# Patient Record
Sex: Male | Born: 1965
Health system: Southern US, Community
[De-identification: ages and names within clinical notes are randomized; demographics above are authoritative.]

## PROBLEM LIST (undated history)

## (undated) DIAGNOSIS — N1831 Chronic kidney disease, stage 3a: Secondary | ICD-10-CM

## (undated) DIAGNOSIS — T7840XA Allergy, unspecified, initial encounter: Secondary | ICD-10-CM

## (undated) DIAGNOSIS — I10 Essential (primary) hypertension: Secondary | ICD-10-CM

## (undated) DIAGNOSIS — N189 Chronic kidney disease, unspecified: Secondary | ICD-10-CM

## (undated) DIAGNOSIS — Q600 Renal agenesis, unilateral: Secondary | ICD-10-CM

## (undated) HISTORY — DX: Renal agenesis, unilateral: Q60.0

## (undated) HISTORY — DX: Allergy, unspecified, initial encounter: T78.40XA

## (undated) HISTORY — DX: Chronic kidney disease, unspecified: N18.9

## (undated) HISTORY — DX: Essential (primary) hypertension: I10

## (undated) HISTORY — PX: CHEST TUBE INSERTION: SHX231

## (undated) HISTORY — DX: Chronic kidney disease, stage 3a: N18.31

## (undated) HISTORY — PX: VASECTOMY: SHX75

---
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
## 2008-03-18 HISTORY — PX: TYMPANOSTOMY TUBE PLACEMENT: SHX32

## 2009-03-07 ENCOUNTER — Ambulatory Visit: Payer: Self-pay | Admitting: Family Medicine

## 2009-03-07 DIAGNOSIS — Z9189 Other specified personal risk factors, not elsewhere classified: Secondary | ICD-10-CM | POA: Insufficient documentation

## 2009-03-07 DIAGNOSIS — I10 Essential (primary) hypertension: Secondary | ICD-10-CM | POA: Insufficient documentation

## 2009-03-09 LAB — CONVERTED CEMR LAB
Albumin: 3.9 g/dL (ref 3.5–5.2)
BUN: 14 mg/dL (ref 6–23)
Basophils Absolute: 0 10*3/uL (ref 0.0–0.1)
CO2: 29 meq/L (ref 19–32)
Chloride: 104 meq/L (ref 96–112)
Cholesterol: 199 mg/dL (ref 0–200)
Eosinophils Absolute: 0.1 10*3/uL (ref 0.0–0.7)
GFR calc non Af Amer: 64.11 mL/min (ref >60)
Glucose, Bld: 103 mg/dL — ABNORMAL HIGH (ref 70–99)
HCT: 44.5 % (ref 39.0–52.0)
HDL: 39.5 mg/dL (ref >39.00)
Lymphs Abs: 1.5 10*3/uL (ref 0.7–4.0)
MCHC: 32.9 g/dL (ref 30.0–36.0)
MCV: 81.9 fL (ref 78.0–100.0)
Monocytes Absolute: 0.6 10*3/uL (ref 0.1–1.0)
Neutro Abs: 4.2 10*3/uL (ref 1.4–7.7)
Platelets: 264 10*3/uL (ref 150.0–400.0)
Potassium: 3.8 meq/L (ref 3.5–5.1)
RDW: 12.9 % (ref 11.5–14.6)
TSH: 0.79 microintl units/mL (ref 0.35–5.50)
Total Bilirubin: 0.9 mg/dL (ref 0.3–1.2)
VLDL: 15.6 mg/dL (ref 0.0–40.0)

## 2009-10-12 ENCOUNTER — Ambulatory Visit: Payer: Self-pay | Admitting: Family Medicine

## 2009-10-12 DIAGNOSIS — J069 Acute upper respiratory infection, unspecified: Secondary | ICD-10-CM | POA: Insufficient documentation

## 2010-04-17 NOTE — Assessment & Plan Note (Signed)
Summary: COUGH, CONGESTION/ lb   Vital Signs:  Patient profile:   45 year old male Height:      71 inches Weight:      259.2 pounds BMI:     36.28 Temp:     98.8 degrees F oral Pulse rate:   76 / minute Pulse rhythm:   regular BP sitting:   120 / 88  (left arm) Cuff size:   large  Vitals Entered By: Benny Lennert CMA Duncan Dull) (October 12, 2009 11:52 AM)  History of Present Illness: Chief complaint cough and congestion   This 45 Years Old Black Male comes in today with complaints of cough, runny nose, and sore throat. has been ongoing for about 6 days  using some delsym and allegra   no fever, chills, sweats  GEN: WDWN, NAD; alert,appropriate and cooperative throughout exam HEENT: Normocephalic and atraumatic. Throat clear, w/o exudate, no LAD, R TM clear, L TM - good landmarks, No fluid present. rhinnorhea.  Left frontal and maxillary sinuses: NT Right frontal and maxillary sinuses: NT NECK: No ant or post LAD CV: RRR, No M/G/R PULM: no resp distress, no accessory muscles.  No retractions. no w/c/r ABD: S,NT,ND,+BS, No HSM EXTR: no c/c/e PSYCH: full affect, pleasant, conversant   Allergies (verified): No Known Drug Allergies   Impression & Recommendations:  Problem # 1:  URI (ICD-465.9)  His updated medication list for this problem includes:    Hydrocodone-homatropine 5-1.5 Mg/11ml Syrp (Hydrocodone-homatropine) .Marland Kitchen... 1 tsp by mouth at bedtime as needed cough  Complete Medication List: 1)  Benazepril Hcl 40 Mg Tabs (Benazepril hcl) .... Once a day 2)  Multivitamins Caps (Multiple vitamin) .... Once a day 3)  Hydrocodone-homatropine 5-1.5 Mg/44ml Syrp (Hydrocodone-homatropine) .Marland Kitchen.. 1 tsp by mouth at bedtime as needed cough  Patient Instructions: 1)  Upper Respiratory Infection 2)  -Viral Infections 3)  TREATMENT 4)  1. Drink plenty of fluids, but limit caffeine 5)  3. Nasal Sprays: Relieve pressure, promote drainage, open nasal and ear passages. Afrin or  Neosynephrine can be used for only 3 days in row. 6)  4. Nasal Saline: Moisten and smooth membranes, no side effects  7)  5. Cough Suppressants: Several types over the counter such as DM. Codeine and Hydrocodon are prescription narcotic medicines that are powerful cough suppressants. 8)  DM cough suppressant usually are well tolerated with minimal side effects 9)  6. Expectorants: Liquify secretions and improve drainage. (ex=Robitussin or Mucinex) Prescriptions: HYDROCODONE-HOMATROPINE 5-1.5 MG/5ML SYRP (HYDROCODONE-HOMATROPINE) 1 tsp by mouth at bedtime as needed cough  #8 oz x 0   Entered and Authorized by:   Hannah Beat MD   Signed by:   Hannah Beat MD on 10/12/2009   Method used:   Print then Give to Patient   RxID:   [id number]   Current Allergies (reviewed today): No known allergies

## 2010-10-02 ENCOUNTER — Other Ambulatory Visit: Payer: Self-pay | Admitting: Family Medicine

## 2010-10-02 NOTE — Telephone Encounter (Signed)
Ok to refill #90, 0 refills  Schedule CPX this summer

## 2010-10-02 NOTE — Telephone Encounter (Signed)
Patient not seen for cpx

## 2010-12-03 ENCOUNTER — Other Ambulatory Visit (INDEPENDENT_AMBULATORY_CARE_PROVIDER_SITE_OTHER): Payer: Federal, State, Local not specified - PPO

## 2010-12-03 DIAGNOSIS — I10 Essential (primary) hypertension: Secondary | ICD-10-CM

## 2010-12-03 DIAGNOSIS — Z125 Encounter for screening for malignant neoplasm of prostate: Secondary | ICD-10-CM

## 2010-12-03 DIAGNOSIS — Z0000 Encounter for general adult medical examination without abnormal findings: Secondary | ICD-10-CM

## 2010-12-03 LAB — CBC WITH DIFFERENTIAL/PLATELET
Basophils Absolute: 0 10*3/uL (ref 0.0–0.1)
Basophils Relative: 0.7 % (ref 0.0–3.0)
Eosinophils Absolute: 0.2 10*3/uL (ref 0.0–0.7)
Hemoglobin: 14.7 g/dL (ref 13.0–17.0)
Lymphocytes Relative: 24.3 % (ref 12.0–46.0)
MCHC: 33.6 g/dL (ref 30.0–36.0)
MCV: 80.9 fl (ref 78.0–100.0)
Monocytes Absolute: 0.6 10*3/uL (ref 0.1–1.0)
Neutro Abs: 4.8 10*3/uL (ref 1.4–7.7)
Neutrophils Relative %: 64.6 % (ref 43.0–77.0)
RBC: 5.42 Mil/uL (ref 4.22–5.81)
RDW: 14.1 % (ref 11.5–14.6)

## 2010-12-03 LAB — BASIC METABOLIC PANEL
CO2: 27 mEq/L (ref 19–32)
Calcium: 9.2 mg/dL (ref 8.4–10.5)
Chloride: 104 mEq/L (ref 96–112)
Creatinine, Ser: 1.2 mg/dL (ref 0.4–1.5)
Glucose, Bld: 81 mg/dL (ref 70–99)

## 2010-12-03 LAB — HEPATIC FUNCTION PANEL
Albumin: 3.8 g/dL (ref 3.5–5.2)
Alkaline Phosphatase: 75 U/L (ref 39–117)
Total Protein: 6.7 g/dL (ref 6.0–8.3)

## 2010-12-03 LAB — LIPID PANEL
Cholesterol: 200 mg/dL (ref 0–200)
HDL: 39.1 mg/dL (ref >39.00)
Triglycerides: 136 mg/dL (ref 0.0–149.0)
VLDL: 27.2 mg/dL (ref 0.0–40.0)

## 2010-12-07 ENCOUNTER — Encounter: Payer: Self-pay | Admitting: Family Medicine

## 2010-12-10 ENCOUNTER — Encounter: Payer: Self-pay | Admitting: Family Medicine

## 2010-12-10 ENCOUNTER — Ambulatory Visit (INDEPENDENT_AMBULATORY_CARE_PROVIDER_SITE_OTHER): Payer: Federal, State, Local not specified - PPO | Admitting: Family Medicine

## 2010-12-10 VITALS — BP 120/78 | HR 80 | Temp 99.5°F | Ht 70.5 in | Wt 273.4 lb

## 2010-12-10 DIAGNOSIS — I10 Essential (primary) hypertension: Secondary | ICD-10-CM

## 2010-12-10 DIAGNOSIS — Z0000 Encounter for general adult medical examination without abnormal findings: Secondary | ICD-10-CM

## 2010-12-10 DIAGNOSIS — Z23 Encounter for immunization: Secondary | ICD-10-CM

## 2010-12-10 MED ORDER — LOSARTAN POTASSIUM 50 MG PO TABS: 50.0000 mg | ORAL_TABLET | Freq: Every day | ORAL | Status: DC

## 2010-12-10 NOTE — Progress Notes (Addendum)
Subjective:    Patient ID: Andrew Stout, male    DOB: 26-Sep-1965, 45 y.o.   MRN: [id number]  HPI  Andrew Stout, a 45 y.o. male presents today in the office for the following:    Sore a little on the bottom on foot on L No colon ca or prostate Dry cough.  Preventative Health Maintenance Visit:  Health Maintenance Summary Reviewed and updated, unless pt declines services.  Tobacco History Reviewed. Alcohol: No concerns, no excessive use Exercise Habits: Some activity, rec at least 30 mins 5 times a week STD concerns: no risk or activity to increase risk Drug Use: None Encouraged self-testicular check  Health Maintenance  Topic Date Due  . Tetanus/tdap  03/19/2015    Labs reviewed with the patient.   Lipids:    Component Value Date/Time   CHOL 200 12/03/2010 1350   TRIG 136.0 12/03/2010 1350   HDL 39.10 12/03/2010 1350   VLDL 27.2 12/03/2010 1350   CHOLHDL 5 12/03/2010 1350    CBC:    Component Value Date/Time   WBC 7.5 12/03/2010 1350   HGB 14.7 12/03/2010 1350   HCT 43.8 12/03/2010 1350   PLT 304.0 12/03/2010 1350   MCV 80.9 12/03/2010 1350   NEUTROABS 4.8 12/03/2010 1350   LYMPHSABS 1.8 12/03/2010 1350   MONOABS 0.6 12/03/2010 1350   EOSABS 0.2 12/03/2010 1350   BASOSABS 0.0 12/03/2010 1350    Basic Metabolic Panel:    Component Value Date/Time   NA 140 12/03/2010 1350   K 3.8 12/03/2010 1350   CL 104 12/03/2010 1350   CO2 27 12/03/2010 1350   BUN 18 12/03/2010 1350   CREATININE 1.2 12/03/2010 1350   GLUCOSE 81 12/03/2010 1350   CALCIUM 9.2 12/03/2010 1350    Lab Results  Component Value Date   ALT 19 12/03/2010   AST 32 12/03/2010   ALKPHOS 75 12/03/2010   BILITOT 0.1* 12/03/2010    Lab Results  Component Value Date   PSA 0.32 12/03/2010   PSA 0.24 03/07/2009   HTN: Patient is having a dry cough for 1-2 mo and on an ace Stable and at goal No CP, no sob. No HA.  BP Readings from Last 3 Encounters:  12/10/10 120/78  10/12/09 120/88  03/07/09  120/78   Mild L PF mid pain  The PMH, PSH, Social History, Family History, Medications, and allergies have been reviewed in [facility], and have been updated if relevant.   Review of Systems  General: Denies fever, chills, sweats. No significant weight loss. Eyes: Denies blurring,significant itching ENT: Denies earache, sore throat, and hoarseness. Cardiovascular: Denies chest pains, palpitations, dyspnea on exertion Respiratory: above  Breast: no concerns about lumps GI: Denies nausea, vomiting, diarrhea, constipation, change in bowel habits, abdominal pain, melena, hematochezia GU: Denies penile discharge, ED, urinary flow / outflow problems. No STD concerns. Musculoskeletal: above Derm: Denies rash, itching Neuro: Denies  paresthesias, frequent falls, frequent headaches Psych: Denies depression, anxiety Endocrine: Denies cold intolerance, heat intolerance, polydipsia Heme: Denies enlarged lymph nodes Allergy: No hayfever     Objective:   Physical Exam   Physical Exam  Blood pressure 120/78, pulse 80, temperature 99.5 F (37.5 C), temperature source Oral, height 5' 10.5" (1.791 m), weight 273 lb 6.4 oz (124.013 kg), SpO2 99.00%.  PE: GEN: well developed, well nourished, no acute distress Eyes: conjunctiva and lids normal, PERRLA, EOMI ENT: TM clear, nares clear, oral exam WNL Neck: supple, no lymphadenopathy, no thyromegaly, no JVD Pulm: clear to auscultation  and percussion, respiratory effort normal CV: regular rate and rhythm, S1-S2, no murmur, rub or gallop, no bruits, peripheral pulses normal and symmetric, no cyanosis, clubbing, edema or varicosities Chest: no scars, masses, no gynecomastia   GI: soft, non-tender; no hepatosplenomegaly, masses; active bowel sounds all quadrants GU: no hernia, testicular mass, penile discharge, priapism or prostate enlargement Lymph: no cervical, axillary or inguinal adenopathy MSK: gait normal, muscle tone and strength WNL, no joint  swelling, effusions, discoloration, crepitus . Mid portion L PF mildly TTP SKIN: clear, good turgor, color WNL, no rashes, lesions, or ulcerations Neuro: normal mental status, normal strength, sensation, and motion Psych: alert; oriented to person, place and time, normally interactive and not anxious or depressed in appearance.       Assessment & Plan:   1. Routine general medical examination at a health care facility    2. Flu vaccine need  Flu vaccine greater than or equal to 3yo preservative free IM  3. HYPERTENSION  losartan (COZAAR) 50 MG tablet    The patient's preventative maintenance and recommended screening tests for an annual wellness exam were reviewed in full today. Brought up to date unless services declined.  Counselled on the importance of diet, exercise, and its role in overall health and mortality. The patient's FH and SH was reviewed, including their home life, tobacco status, and drug and alcohol status.   HTN: suspect ACE cough, change to ARB

## 2010-12-10 NOTE — Progress Notes (Signed)
  Subjective:    Patient ID: Andrew Stout, male    DOB: 07-19-1965, 45 y.o.   MRN: [id number]  HPI    Review of Systems     Objective:   Physical Exam        Assessment & Plan:

## 2011-07-31 ENCOUNTER — Encounter: Payer: Self-pay | Admitting: Family Medicine

## 2011-07-31 ENCOUNTER — Ambulatory Visit (INDEPENDENT_AMBULATORY_CARE_PROVIDER_SITE_OTHER): Payer: Federal, State, Local not specified - PPO | Admitting: Family Medicine

## 2011-07-31 VITALS — BP 130/78 | HR 80 | Temp 99.1°F | Ht 70.5 in | Wt 275.0 lb

## 2011-07-31 DIAGNOSIS — S0093XA Contusion of unspecified part of head, initial encounter: Secondary | ICD-10-CM

## 2011-07-31 DIAGNOSIS — T07XXXA Unspecified multiple injuries, initial encounter: Secondary | ICD-10-CM

## 2011-07-31 NOTE — Progress Notes (Signed)
  Patient Name: Andrew Stout Date of Birth: 06/01/1965 Age: 46 y.o. Medical Record Number: [id number] Gender: male Date of Encounter: 07/31/2011  History of Present Illness:  Andrew Stout is a 46 y.o. very pleasant male patient who presents with the following:  Larey Seat and hit his head, two weeks ago. Saw his mother on mother's day. No nausea, no dizziness. No LOC, no HA, no blurred vision, no photophobia, no phonophobia. No problems at all except local swelling and pain.   Past Medical History, Surgical History, Social History, Family History, Problem List, Medications, and Allergies have been reviewed and updated if relevant.  Review of Systems:  GEN: No acute illnesses, no fevers, chills. GI: No n/v/d, eating normally Pulm: No SOB Interactive and getting along well at home.  Otherwise, ROS is as per the HPI.   Physical Examination: Filed Vitals:   07/31/11 1359  BP: 130/78  Pulse: 80  Temp: 99.1 F (37.3 C)  TempSrc: Oral  Height: 5' 10.5" (1.791 m)  Weight: 275 lb (124.739 kg)  SpO2: 97%    Body mass index is 38.90 kg/(m^2).   GEN: WDWN, NAD, Non-toxic, Alert & Oriented x 3 HEENT: Atraumatic, Normocephalic. TTP top of head Ears and Nose: No external deformity. EXTR: No clubbing/cyanosis/edema NEURO: Normal gait.  PSYCH: Normally interactive. Conversant. Not depressed or anxious appearing.  Calm demeanor.    Assessment and Plan: 1. Contusion of multiple sites of head and neck     DOI 07/17/2011  Reassured and would not do any additional management

## 2011-08-13 ENCOUNTER — Emergency Department (HOSPITAL_COMMUNITY)
Admission: EM | Admit: 2011-08-13 | Discharge: 2011-08-14 | Disposition: A | Discharge status: Home / Self Care | Payer: Federal, State, Local not specified - PPO | Attending: Emergency Medicine | Admitting: Emergency Medicine

## 2011-08-13 ENCOUNTER — Encounter (HOSPITAL_COMMUNITY): Payer: Self-pay | Admitting: Emergency Medicine

## 2011-08-13 DIAGNOSIS — R509 Fever, unspecified: Secondary | ICD-10-CM | POA: Insufficient documentation

## 2011-08-13 DIAGNOSIS — Z905 Acquired absence of kidney: Secondary | ICD-10-CM | POA: Insufficient documentation

## 2011-08-13 DIAGNOSIS — I10 Essential (primary) hypertension: Secondary | ICD-10-CM | POA: Insufficient documentation

## 2011-08-13 DIAGNOSIS — K6389 Other specified diseases of intestine: Secondary | ICD-10-CM

## 2011-08-13 DIAGNOSIS — R109 Unspecified abdominal pain: Secondary | ICD-10-CM | POA: Insufficient documentation

## 2011-08-13 DIAGNOSIS — K5289 Other specified noninfective gastroenteritis and colitis: Secondary | ICD-10-CM | POA: Insufficient documentation

## 2011-08-13 NOTE — ED Notes (Signed)
Patient with right abdominal pain, sharp in nature.  Low grade fever.  No nausea or vomiting or diarrhea.

## 2011-08-14 ENCOUNTER — Emergency Department (HOSPITAL_COMMUNITY): Payer: Federal, State, Local not specified - PPO

## 2011-08-14 LAB — BASIC METABOLIC PANEL
BUN: 10 mg/dL (ref 6–23)
GFR calc Af Amer: 79 mL/min — ABNORMAL LOW (ref >90)
GFR calc non Af Amer: 68 mL/min — ABNORMAL LOW (ref >90)
Potassium: 3.9 mEq/L (ref 3.5–5.1)

## 2011-08-14 LAB — URINALYSIS, ROUTINE W REFLEX MICROSCOPIC
Glucose, UA: NEGATIVE mg/dL
Hgb urine dipstick: NEGATIVE
Ketones, ur: NEGATIVE mg/dL
Protein, ur: NEGATIVE mg/dL
pH: 6 (ref 5.0–8.0)

## 2011-08-14 LAB — CBC
HCT: 43.9 % (ref 39.0–52.0)
MCH: 26.3 pg (ref 26.0–34.0)
MCV: 78.7 fL (ref 78.0–100.0)
Platelets: 303 10*3/uL (ref 150–400)
RBC: 5.58 MIL/uL (ref 4.22–5.81)

## 2011-08-14 LAB — DIFFERENTIAL
Eosinophils Absolute: 0.1 10*3/uL (ref 0.0–0.7)
Eosinophils Relative: 1 % (ref 0–5)
Lymphocytes Relative: 22 % (ref 12–46)
Lymphs Abs: 2.7 10*3/uL (ref 0.7–4.0)
Monocytes Absolute: 0.8 10*3/uL (ref 0.1–1.0)

## 2011-08-14 MED ORDER — IOHEXOL 300 MG/ML  SOLN
20.0000 mL | INTRAMUSCULAR | Status: AC
  Administered 2011-08-14 (×2): 20 mL via ORAL

## 2011-08-14 MED ORDER — SODIUM CHLORIDE 0.9 % IV SOLN
Freq: Once | INTRAVENOUS | Status: AC
  Administered 2011-08-14: 02:00:00 via INTRAVENOUS

## 2011-08-14 MED ORDER — IOHEXOL 300 MG/ML  SOLN
100.0000 mL | Freq: Once | INTRAMUSCULAR | Status: AC | PRN
  Administered 2011-08-14: 100 mL via INTRAVENOUS

## 2011-08-14 MED ORDER — HYDROCODONE-ACETAMINOPHEN 5-500 MG PO TABS: 1.0000 | ORAL_TABLET | Freq: Four times a day (QID) | ORAL | Status: AC | PRN

## 2011-08-14 NOTE — ED Provider Notes (Addendum)
History     CSN: [id number]  Arrival date & time 08/13/11  2133   First MD Initiated Contact with Patient 08/14/11 0135      Chief Complaint  Patient presents with  . Abdominal Pain    (Consider location/radiation/quality/duration/timing/severity/associated sxs/prior treatment) HPI Comments: RLQ pain with low grade fever Denies N/V/D  The history is provided by the patient.    Past Medical History  Diagnosis Date  . Hypertension   . Chronic kidney disease     one kidney    Past Surgical History  Procedure Date  . Tympanostomy tube placement 2010    right  . Nephrostomy 1975    left  . Chest tube insertion   . Vasectomy     No family history on file.  History  Substance Use Topics  . Smoking status: Never Smoker   . Smokeless tobacco: Not on file  . Alcohol Use: No      Review of Systems  Constitutional: Positive for fever. Negative for chills.  Gastrointestinal: Positive for abdominal pain. Negative for nausea, vomiting and diarrhea.  Genitourinary: Negative for dysuria.    Allergies  Review of patient's allergies indicates no known allergies.  Home Medications   Current Outpatient Rx  Name Route Sig Dispense Refill  . LOSARTAN POTASSIUM 50 MG PO TABS Oral Take 1 tablet (50 mg total) by mouth daily. 30 tablet 11  . ONE-DAILY MULTI VITAMINS PO TABS Oral Take 1 tablet by mouth daily.      Marland Kitchen HYDROCODONE-ACETAMINOPHEN 5-500 MG PO TABS Oral Take 1-2 tablets by mouth every 6 (six) hours as needed for pain. 15 tablet 0    BP 137/81  Pulse 86  Temp(Src) 98.7 F (37.1 C) (Oral)  Resp 18  SpO2 100%  Physical Exam  Constitutional: He appears well-developed and well-nourished.  Neck: Normal range of motion.  Cardiovascular: Normal rate.   Pulmonary/Chest: Effort normal.  Abdominal: He exhibits no distension. There is tenderness in the right lower quadrant and suprapubic area. There is no rebound.  Musculoskeletal: Normal range of motion.    Neurological: He is alert.  Skin: Skin is warm.    ED Course  Procedures (including critical care time)  Labs Reviewed  BASIC METABOLIC PANEL - Abnormal; Notable for the following:    Glucose, Bld 100 (*)    GFR calc non Af Amer 68 (*)    GFR calc Af Amer 79 (*)    All other components within normal limits  CBC - Abnormal; Notable for the following:    WBC 12.1 (*)    All other components within normal limits  DIFFERENTIAL - Abnormal; Notable for the following:    Neutro Abs 8.5 (*)    All other components within normal limits  URINALYSIS, ROUTINE W REFLEX MICROSCOPIC - Abnormal; Notable for the following:    Bilirubin Urine SMALL (*)    All other components within normal limits  LACTIC ACID, PLASMA   Ct Abdomen Pelvis W Contrast  08/14/2011  *RADIOLOGY REPORT*  Clinical Data: Right-sided abdominal pain.  Low grade fever and nausea.  History of previous nephrectomy.  CT ABDOMEN AND PELVIS WITH CONTRAST  Technique:  Multidetector CT imaging of the abdomen and pelvis was performed following the standard protocol during bolus administration of intravenous contrast.  Contrast: OMNIPAQUE IOHEXOL 300 MG/ML  SOLN  Comparison: None.  Findings: The lung bases are clear.  Surgical absence of the left kidney.  The liver, spleen, gallbladder, pancreas, adrenal glands, right kidney,  abdominal aorta, and retroperitoneal lymph nodes are unremarkable. Calcification in the iliac arteries.  The stomach and small bowel are not dilated.  Stool filled colon without distension.  No free air or free fluid in the abdomen.  Pelvis:  The prostate gland is not enlarged.  The bladder wall is not thickened.  Calcified phleboliths.  No significant pelvic lymphadenopathy.  The appendix appears normal.  There is focal of May 8 infiltration in the pericolonic fat in the right lower quadrant.  This looks like a mesenteric process such as focal mesenteric infarct.  Epiploic appendagitis can also have this appearance.   No inflammatory changes involving the sigmoid colon. Scattered sigmoid diverticula.  Normal alignment of the lumbar spine.  IMPRESSION: Small focal area of infiltration in the right lower quadrant pericolonic fat with normal appendix.  Changes suggest focal mesenteric infarct versus epiploic appendagitis.  Original Report Authenticated By: Marlon Pel, M.D.     1. Epiploic appendagitis   2. Abdominal pain    Discussed, CT scan results with patient, as well as with Dr. Hyacinth Meeker, who examined the patient.  As well.  He will be given pain medication for home use strict instructions to return if he develops fever or worsening symptoms.  Nausea, vomiting, diarrhea, or blood in his stools.  He does also been instructed to call [facility] first thing in the morning to have it.  Evaluation in her office   MDM   Abdominal pain is concerning for, appendicitis.  We'll rule out with CT scan.  He does have an elevated white count of 12.1        Arman Filter, NP 08/14/11 0426  Arman Filter, NP 08/14/11 0427  Arman Filter, NP 09/24/11 2003

## 2011-08-14 NOTE — ED Provider Notes (Signed)
Medical screening examination/treatment/procedure(s) were conducted as a shared visit with non-physician practitioner(s) and myself.  I personally evaluated the patient during the encounter  Please see my separate respective documentation pertaining to this patient encounter   Vida Roller, MD 08/14/11 0700

## 2011-08-14 NOTE — Discharge Instructions (Signed)
Abdominal Pain Many things can cause belly (abdominal) pain. Most times, the belly pain is not dangerous. The amount of belly pain does not tell how serious the problem may be. Many cases of belly pain can be watched and treated at home. HOME CARE   Do not take medicines that help you go poop (laxatives) unless told to by your doctor.   Only take medicine as told by your doctor.   Eat or drink as told by your doctor. Your doctor will tell you if you should be on a special diet.  GET HELP RIGHT AWAY IF:   The pain does not go away.   You have a fever.   You keep throwing up (vomiting).   The pain changes and is only in the right or left part of the belly.   You have bloody or tarry looking poop.  MAKE SURE YOU:   Understand these instructions.   Will watch your condition.   Will get help right away if you are not doing well or get worse.  Document Released: 08/21/2007 Document Revised: 02/21/2011 Document Reviewed: 03/20/2009 [facility] Patient Information 2012 [place], [place]. As discussed, is very important that you call [facility] first in the morning for followup in the office.  They can review the CT scan in her office.  Please return immediately if you develop new symptoms, fever, increased pain, nausea, vomiting, diarrhea, blood in stool

## 2011-08-14 NOTE — ED Notes (Signed)
46 year old otherwise healthy male who presents with a complaint of right sided abdominal pain which has been ongoing for a couple of days. He states the last week he been having coughing spells which has seemed to improve. Over the last 2 days he has this intermittent right sided abdominal pain which is both in the right mid abdomen and the right lower quadrant, last for a couple of minutes and then totally goes away. He denies fevers chills nausea vomiting back pain dysuria diarrhea or rectal bleeding. He has never had surgery on his abdomen.  Physical exam shows minimal tenderness in the right lower quadrant and right side. He is non-peritoneal and has no other abdominal tenderness. His heart and lungs appear clear, there is no tachycardia, strong peripheral pulses and the patient is very well-appearing.  Labs show that he is a very slight leukocytosis and a CT scan shows a normal appendix.   Results for orders placed during the hospital encounter of 08/13/11  BASIC METABOLIC PANEL      Component Value Range   Sodium 141  135 - 145 (mEq/L)   Potassium 3.9  3.5 - 5.1 (mEq/L)   Chloride 105  96 - 112 (mEq/L)   CO2 27  19 - 32 (mEq/L)   Glucose, Bld 100 (*) 70 - 99 (mg/dL)   BUN 10  6 - 23 (mg/dL)   Creatinine, Ser 1.61  0.50 - 1.35 (mg/dL)   Calcium 9.4  8.4 - 09.6 (mg/dL)   GFR calc non Af Amer 68 (*) >90 (mL/min)   GFR calc Af Amer 79 (*) >90 (mL/min)  CBC      Component Value Range   WBC 12.1 (*) 4.0 - 10.5 (K/uL)   RBC 5.58  4.22 - 5.81 (MIL/uL)   Hemoglobin 14.7  13.0 - 17.0 (g/dL)   HCT 04.5  40.9 - 81.1 (%)   MCV 78.7  78.0 - 100.0 (fL)   MCH 26.3  26.0 - 34.0 (pg)   MCHC 33.5  30.0 - 36.0 (g/dL)   RDW 91.4  78.2 - 95.6 (%)   Platelets 303  150 - 400 (K/uL)  DIFFERENTIAL      Component Value Range   Neutrophils Relative 70  43 - 77 (%)   Neutro Abs 8.5 (*) 1.7 - 7.7 (K/uL)   Lymphocytes Relative 22  12 - 46 (%)   Lymphs Abs 2.7  0.7 - 4.0 (K/uL)   Monocytes Relative 7  3 -  12 (%)   Monocytes Absolute 0.8  0.1 - 1.0 (K/uL)   Eosinophils Relative 1  0 - 5 (%)   Eosinophils Absolute 0.1  0.0 - 0.7 (K/uL)   Basophils Relative 0  0 - 1 (%)   Basophils Absolute 0.0  0.0 - 0.1 (K/uL)  URINALYSIS, ROUTINE W REFLEX MICROSCOPIC      Component Value Range   Color, Urine YELLOW  YELLOW    APPearance CLEAR  CLEAR    Specific Gravity, Urine 1.023  1.005 - 1.030    pH 6.0  5.0 - 8.0    Glucose, UA NEGATIVE  NEGATIVE (mg/dL)   Hgb urine dipstick NEGATIVE  NEGATIVE    Bilirubin Urine SMALL (*) NEGATIVE    Ketones, ur NEGATIVE  NEGATIVE (mg/dL)   Protein, ur NEGATIVE  NEGATIVE (mg/dL)   Urobilinogen, UA 1.0  0.0 - 1.0 (mg/dL)   Nitrite NEGATIVE  NEGATIVE    Leukocytes, UA NEGATIVE  NEGATIVE    Ct Abdomen Pelvis W Contrast  08/14/2011  *RADIOLOGY REPORT*  Clinical Data: Right-sided abdominal pain.  Low grade fever and nausea.  History of previous nephrectomy.  CT ABDOMEN AND PELVIS WITH CONTRAST  Technique:  Multidetector CT imaging of the abdomen and pelvis was performed following the standard protocol during bolus administration of intravenous contrast.  Contrast: OMNIPAQUE IOHEXOL 300 MG/ML  SOLN  Comparison: None.  Findings: The lung bases are clear.  Surgical absence of the left kidney.  The liver, spleen, gallbladder, pancreas, adrenal glands, right kidney, abdominal aorta, and retroperitoneal lymph nodes are unremarkable. Calcification in the iliac arteries.  The stomach and small bowel are not dilated.  Stool filled colon without distension.  No free air or free fluid in the abdomen.  Pelvis:  The prostate gland is not enlarged.  The bladder wall is not thickened.  Calcified phleboliths.  No significant pelvic lymphadenopathy.  The appendix appears normal.  There is focal of May 8 infiltration in the pericolonic fat in the right lower quadrant.  This looks like a mesenteric process such as focal mesenteric infarct.  Epiploic appendagitis can also have this appearance.   No inflammatory changes involving the sigmoid colon. Scattered sigmoid diverticula.  Normal alignment of the lumbar spine.  IMPRESSION: Small focal area of infiltration in the right lower quadrant pericolonic fat with normal appendix.  Changes suggest focal mesenteric infarct versus epiploic appendagitis.  Original Report Authenticated By: Marlon Pel, M.D.   After examining the patient and looking at his CAT scan, I believe that this is probably epiploic appendagitis and much less likely related to a mesenteric ischemia. He has not had atrial fibrillation and has no other risk factors for this disease process.  Filed Vitals:   08/14/11 0307  BP: 137/81  Pulse: 86  Temp: 98.7 F (37.1 C)  Resp: 18    Vital signs are normal, I discussed the signs with the patient and reexamined his abdomen and believe he is stable for discharge at this time. He will followup with the general surgeons after calling them in the morning to establish a followup appointment.  Medical screening examination/treatment/procedure(s) were conducted as a shared visit with non-physician practitioner(s) and myself.  I personally evaluated the patient during the encounter     Vida Roller, MD 08/14/11 [phone]

## 2011-08-16 ENCOUNTER — Encounter (INDEPENDENT_AMBULATORY_CARE_PROVIDER_SITE_OTHER): Payer: Self-pay | Admitting: Surgery

## 2011-08-16 ENCOUNTER — Ambulatory Visit (INDEPENDENT_AMBULATORY_CARE_PROVIDER_SITE_OTHER): Payer: Federal, State, Local not specified - PPO | Admitting: Surgery

## 2011-08-16 VITALS — BP 142/88 | HR 80 | Temp 98.2°F | Resp 14 | Ht 70.0 in | Wt 271.0 lb

## 2011-08-16 DIAGNOSIS — K6389 Other specified diseases of intestine: Secondary | ICD-10-CM

## 2011-08-16 DIAGNOSIS — K5289 Other specified noninfective gastroenteritis and colitis: Secondary | ICD-10-CM

## 2011-08-16 NOTE — Progress Notes (Signed)
Patient ID: Andrew Stout, male   DOB: 04-19-1965, 46 y.o.   MRN: [id number]  Chief Complaint  Patient presents with  . Abdominal Pain    HPI Andrew Stout is a 46 y.o. male.  This is a very pleasant gentleman referred by the emergency department for right lower quadrant abdominal pain. He had the sudden onset of right lower quadrant abdominal pain on Monday. He described it as sharp and continuous in nature. It did not referring where else. He had no nausea or vomiting. Bowel movements are normal. He did have a low-grade temperature. He presented to the emergency department where a CAT scan was performed suggesting epiploic appendicitis. He is now feeling much better and only needing pain medications at night. He now denies fevers HPI  Past Medical History  Diagnosis Date  . Hypertension   . Chronic kidney disease     one kidney    Past Surgical History  Procedure Date  . Tympanostomy tube placement 2010    right  . Nephrostomy 1975    left  . Chest tube insertion   . Vasectomy     History reviewed. No pertinent family history.  Social History History  Substance Use Topics  . Smoking status: Never Smoker   . Smokeless tobacco: Not on file  . Alcohol Use: No    No Known Allergies  Current Outpatient Prescriptions  Medication Sig Dispense Refill  . HYDROcodone-acetaminophen (VICODIN) 5-500 MG per tablet Take 1-2 tablets by mouth every 6 (six) hours as needed for pain.  15 tablet  0  . losartan (COZAAR) 50 MG tablet Take 1 tablet (50 mg total) by mouth daily.  30 tablet  11  . Multiple Vitamin (MULTIVITAMIN) tablet Take 1 tablet by mouth daily.          Review of Systems Review of Systems  All other systems reviewed and are negative.    Blood pressure 142/88, pulse 80, temperature 98.2 F (36.8 C), resp. rate 14, height 5\' 10"  (1.778 m), weight 271 lb (122.925 kg).  Physical Exam Physical Exam  Constitutional: He appears well-developed and  well-nourished. No distress.  HENT:  Head: Normocephalic and atraumatic.  Neck: Normal range of motion. Neck supple.  Cardiovascular: Normal rate, regular rhythm, normal heart sounds and intact distal pulses.   No murmur heard. Pulmonary/Chest: Effort normal and breath sounds normal. No respiratory distress. He has no wheezes.  Abdominal: Soft. Bowel sounds are normal.       There is mild tenderness with guarding in the right lower quadrant  Skin: He is not diaphoretic.    Data Reviewed I have reviewed his CAT scan  Assessment    Epiploic appendicitis    Plan    As he is improving, and there are no further recommendations other than conservative management with pain control. I explained the diagnosis to him and his wife in detail. Should he have return of his discomfort or fevers he will call me as soon as possible.       Lyam Provencio A 08/16/2011, 1:44 PM

## 2011-09-27 NOTE — ED Provider Notes (Signed)
Medical screening examination/treatment/procedure(s) were performed by non-physician practitioner and as supervising physician I was immediately available for consultation/collaboration.    Vida Roller, MD 09/27/11 2259

## 2011-12-12 ENCOUNTER — Other Ambulatory Visit: Payer: Self-pay | Admitting: Family Medicine

## 2011-12-12 NOTE — Telephone Encounter (Signed)
Ok to refill 30, 2 refills  Schedule CPX

## 2011-12-12 NOTE — Telephone Encounter (Signed)
Refill request for Losartan 50 mg. Last OV was CPE on 12/10/10. Ok to refill?

## 2011-12-12 NOTE — Telephone Encounter (Signed)
Rx losartan 50 mg #30 2 R called in to [facility]. Patient scheduled appt for 01/06/12 @2 :45 pm for CPE

## 2012-01-06 ENCOUNTER — Encounter: Payer: Self-pay | Admitting: Family Medicine

## 2012-01-06 ENCOUNTER — Ambulatory Visit (INDEPENDENT_AMBULATORY_CARE_PROVIDER_SITE_OTHER): Payer: Federal, State, Local not specified - PPO | Admitting: Family Medicine

## 2012-01-06 VITALS — BP 116/80 | HR 84 | Temp 98.5°F | Ht 70.0 in | Wt 265.5 lb

## 2012-01-06 DIAGNOSIS — Z125 Encounter for screening for malignant neoplasm of prostate: Secondary | ICD-10-CM

## 2012-01-06 DIAGNOSIS — Z0000 Encounter for general adult medical examination without abnormal findings: Secondary | ICD-10-CM

## 2012-01-06 DIAGNOSIS — Z13220 Encounter for screening for lipoid disorders: Secondary | ICD-10-CM

## 2012-01-06 NOTE — Progress Notes (Signed)
Nature conservation officer at [facility] [address] [place] [address] Phone: [phone] Fax: [phone]  Date:  01/06/2012   Name:  Andrew Stout [facility]   DOB:  1966-03-05   MRN:  [id number] Gender: male Age: 46 y.o.  PCP:  Hannah Beat, MD  Evaluating MD: Hannah Beat, MD   Chief Complaint: Annual Exam   History of Present Illness:  Andrew Stout is a 46 y.o. pleasant patient who presents with the following:  Some sore knees, some in the back.   Preventative Health Maintenance Visit:  Health Maintenance Summary Reviewed and updated, unless pt declines services.  Tobacco History Reviewed. Alcohol: No concerns, no excessive use Exercise Habits: Some activity, rec at least 30 mins 5 times a week STD concerns: no risk or activity to increase risk Drug Use: None Encouraged self-testicular check  Health Maintenance  Topic Date Due  . Influenza Vaccine  11/16/2012  . Tetanus/tdap  03/19/2015    Patient Active Problem List  Diagnosis  . HYPERTENSION  . CHICKENPOX, HX OF  . Epiploic appendagitis    Past Medical History  Diagnosis Date  . Hypertension   . Chronic kidney disease     one kidney    Past Surgical History  Procedure Date  . Tympanostomy tube placement 2010    right  . Nephrostomy 1975    left  . Chest tube insertion   . Vasectomy     History  Substance Use Topics  . Smoking status: Never Smoker   . Smokeless tobacco: Never Used  . Alcohol Use: No    No family history on file.  No Known Allergies  Medication list has been reviewed and updated.  Outpatient Prescriptions Prior to Visit  Medication Sig Dispense Refill  . losartan (COZAAR) 50 MG tablet TAKE 1 TABLET BY MOUTH EVERY DAY  30 tablet  2  . Multiple Vitamin (MULTIVITAMIN) tablet Take 1 tablet by mouth daily.          Review of Systems:   General: Denies fever, chills, sweats. No significant weight loss. Eyes: Denies blurring,significant itching ENT:  Denies earache, sore throat, and hoarseness. Cardiovascular: Denies chest pains, palpitations, dyspnea on exertion Respiratory: Denies cough, dyspnea at rest,wheeezing Breast: no concerns about lumps GI: EPIPLOIC APENDIGITIS IN 07/2011, NONOP RESOLVED GU: Denies penile discharge, ED, urinary flow / outflow problems. No STD concerns. Musculoskeletal: occ joint pains Derm: Denies rash, itching Neuro: Denies  paresthesias, frequent falls, frequent headaches Psych: Denies depression, anxiety Endocrine: Denies cold intolerance, heat intolerance, polydipsia Heme: Denies enlarged lymph nodes Allergy: No hayfever   Physical Examination: Filed Vitals:   01/06/12 1449  BP: 116/80  Pulse: 84  Temp: 98.5 F (36.9 C)  TempSrc: Oral  Height: 5\' 10"  (1.778 m)  Weight: 265 lb 8 oz (120.43 kg)    Body mass index is 38.10 kg/(m^2). Ideal Body Weight: Weight in (lb) to have BMI = 25: 173.9    Wt Readings from Last 3 Encounters:  01/06/12 265 lb 8 oz (120.43 kg)  08/16/11 271 lb (122.925 kg)  07/31/11 275 lb (124.739 kg)    GEN: well developed, well nourished, no acute distress Eyes: conjunctiva and lids normal, PERRLA, EOMI ENT: TM clear, nares clear, oral exam WNL Neck: supple, no lymphadenopathy, no thyromegaly, no JVD Pulm: clear to auscultation and percussion, respiratory effort normal CV: regular rate and rhythm, S1-S2, no murmur, rub or gallop, no bruits, peripheral pulses normal and symmetric, no cyanosis, clubbing, edema or varicosities Chest:  no scars, masses GI: soft, non-tender; no hepatosplenomegaly, masses; active bowel sounds all quadrants GU: no hernia, testicular mass, penile discharge, or prostate enlargement Lymph: no cervical, axillary or inguinal adenopathy MSK: gait normal, muscle tone and strength WNL, no joint swelling, effusions, discoloration, crepitus  SKIN: clear, good turgor, color WNL, no rashes, lesions, or ulcerations Neuro: normal mental status, normal  strength, sensation, and motion Psych: alert; oriented to person, place and time, normally interactive and not anxious or depressed in appearance.  Assessment and Plan:  1. Routine general medical examination at a health care facility  Basic metabolic panel, CBC with Differential  2. Screening for lipoid disorders  Lipid panel  3. Special screening for malignant neoplasm of prostate  PSA   The patient's preventative maintenance and recommended screening tests for an annual wellness exam were reviewed in full today. Brought up to date unless services declined.  Counselled on the importance of diet, exercise, and its role in overall health and mortality. The patient's FH and SH was reviewed, including their home life, tobacco status, and drug and alcohol status.   Exercise.  Orders Today:  Orders Placed This Encounter  Procedures  . Basic metabolic panel  . CBC with Differential  . Lipid panel  . PSA    Updated Medication List: (Includes new medications, updates to list, dose adjustments) No orders of the defined types were placed in this encounter.    Medications Discontinued: Medications Discontinued During This Encounter  Medication Reason  . losartan (COZAAR) 50 MG tablet      Hannah Beat, MD

## 2012-01-07 LAB — CBC WITH DIFFERENTIAL/PLATELET
Eosinophils Absolute: 0.1 10*3/uL (ref 0.0–0.7)
Eosinophils Relative: 0.6 % (ref 0.0–5.0)
HCT: 45.9 % (ref 39.0–52.0)
Lymphs Abs: 1.7 10*3/uL (ref 0.7–4.0)
MCHC: 32.9 g/dL (ref 30.0–36.0)
MCV: 81.2 fl (ref 78.0–100.0)
Monocytes Absolute: 0.6 10*3/uL (ref 0.1–1.0)
Platelets: 318 10*3/uL (ref 150.0–400.0)
RDW: 13.7 % (ref 11.5–14.6)
WBC: 9.2 10*3/uL (ref 4.5–10.5)

## 2012-01-07 LAB — LIPID PANEL
Cholesterol: 217 mg/dL — ABNORMAL HIGH (ref 0–200)
Total CHOL/HDL Ratio: 6

## 2012-01-08 LAB — BASIC METABOLIC PANEL
BUN: 12 mg/dL (ref 6–23)
CO2: 29 mEq/L (ref 19–32)
Chloride: 105 mEq/L (ref 96–112)
Creatinine, Ser: 1.3 mg/dL (ref 0.4–1.5)
Glucose, Bld: 84 mg/dL (ref 70–99)
Potassium: 4.5 mEq/L (ref 3.5–5.1)

## 2012-01-09 LAB — PSA: PSA: 0.26 ng/mL (ref 0.10–4.00)

## 2012-01-10 ENCOUNTER — Encounter: Payer: Self-pay | Admitting: *Deleted

## 2012-01-10 ENCOUNTER — Telehealth: Payer: Self-pay

## 2012-01-10 NOTE — Telephone Encounter (Signed)
Pt requested results 01/09/12.Patient notified as instructed by telephone.

## 2013-01-01 ENCOUNTER — Encounter: Payer: Self-pay | Admitting: Family Medicine

## 2013-01-01 ENCOUNTER — Ambulatory Visit (INDEPENDENT_AMBULATORY_CARE_PROVIDER_SITE_OTHER): Payer: Federal, State, Local not specified - PPO | Admitting: Family Medicine

## 2013-01-01 ENCOUNTER — Telehealth: Payer: Self-pay

## 2013-01-01 VITALS — BP 128/88 | HR 70 | Temp 98.3°F | Ht 70.0 in | Wt 255.8 lb

## 2013-01-01 DIAGNOSIS — G5600 Carpal tunnel syndrome, unspecified upper limb: Secondary | ICD-10-CM

## 2013-01-01 DIAGNOSIS — L259 Unspecified contact dermatitis, unspecified cause: Secondary | ICD-10-CM | POA: Insufficient documentation

## 2013-01-01 DIAGNOSIS — G5601 Carpal tunnel syndrome, right upper limb: Secondary | ICD-10-CM | POA: Insufficient documentation

## 2013-01-01 MED ORDER — IBUPROFEN 800 MG PO TABS: 800.0000 mg | ORAL_TABLET | Freq: Three times a day (TID) | ORAL | Status: DC | PRN

## 2013-01-01 MED ORDER — TRIAMCINOLONE ACETONIDE 0.5 % EX OINT: TOPICAL_OINTMENT | Freq: Two times a day (BID) | CUTANEOUS | Status: DC

## 2013-01-01 NOTE — Telephone Encounter (Signed)
Dr Ermalene Searing; pt thought Ibuprofen 800mg  would be sent to [facility].Please advise.

## 2013-01-01 NOTE — Telephone Encounter (Signed)
Notify pt - rx sent

## 2013-01-01 NOTE — Patient Instructions (Addendum)
Ibuprofen 800 mg every 8 hours as needed for pain. Wear carpal tunnel brace at bedtime on right wrist. Call if not improving in 2 weeks.. Or follow up with PCP. Start steroid cream on rash twice daily.

## 2013-01-01 NOTE — Progress Notes (Signed)
  Subjective:    Patient ID: Andrew Stout, male    DOB: [birth year], 47 y.o.   MRN: [id number]  HPI  47 year old male pt of Dr. Cyndie Chime presents with  New onset rash 1.5 weeks ago. Puffy red on left upper chest. Itchy, no painful.  Applied calamine lotion, without much relief.  No fever. No flu like symptoms.   Wife had similar rash last week as well... ? Reaction to something in environment.   He works for [employer], Magazine features editor.. having pain  Down elbow, arm and pain in palm, numbness in 4th and 5th digit.  no pain in neck, occ pain in upper arm.     Review of Systems  Constitutional: Negative for fever and fatigue.  HENT: Negative for ear pain.   Eyes: Negative for pain.  Respiratory: Negative for shortness of breath.   Cardiovascular: Negative for chest pain.       Objective:   Physical Exam  Constitutional: He is oriented to person, place, and time. Vital signs are normal. He appears well-developed and well-nourished.  HENT:  Head: Normocephalic.  Right Ear: Hearing normal.  Left Ear: Hearing normal.  Nose: Nose normal.  Mouth/Throat: Oropharynx is clear and moist and mucous membranes are normal.  Neck: Trachea normal. Carotid bruit is not present. No mass and no thyromegaly present.  Cardiovascular: Normal rate, regular rhythm and normal pulses.  Exam reveals no gallop, no distant heart sounds and no friction rub.   No murmur heard. No peripheral edema  Pulmonary/Chest: Effort normal and breath sounds normal. No respiratory distress.  Musculoskeletal:       Cervical back: Normal. He exhibits normal range of motion and no tenderness.       Left forearm: He exhibits no tenderness.       Left hand: He exhibits tenderness. He exhibits normal range of motion.  Neg spurling's , neg ulnar compression, neg tinel and phalen  Neurological: He is alert and oriented to person, place, and time.  Skin: Skin is warm, dry and intact. No rash noted.  Erythematous  patches  On left upper chest, no vesicles, no pustules, no flake.  Psychiatric: He has a normal mood and affect. His speech is normal and behavior is normal. Thought content normal.          Assessment & Plan:

## 2013-01-04 NOTE — Telephone Encounter (Signed)
Left message on cell phone that Rx has been sent in to [facility] on [address].

## 2013-01-09 NOTE — Assessment & Plan Note (Signed)
Wear brace at night, NSAIDs, avoid compression of carpal tunnel at work.

## 2013-01-09 NOTE — Assessment & Plan Note (Signed)
Topical steroid to treat. Follow up if not improving.

## 2013-01-14 ENCOUNTER — Telehealth: Payer: Self-pay | Admitting: Family Medicine

## 2013-01-14 NOTE — Telephone Encounter (Signed)
Spoke with Andrew Stout.  He will stop by tomorrow to get fitted for left cock up wrist splint.

## 2013-01-14 NOTE — Telephone Encounter (Signed)
Pt left vm stating that he saw Dr. Patsy Lager 2 weeks ago for carpel tunnel in R arm.  He has been wearing his splint as directed but is now having trouble with his left arm.  He wants to know if he needs to come in for another appt for another splint for that arm or just continue taking ibuprofen as directed and see if it gets any better.

## 2013-01-14 NOTE — Telephone Encounter (Signed)
He can just get a left cock up wrist splint, i don't need to see him  Dx CTS

## 2013-02-24 ENCOUNTER — Ambulatory Visit (INDEPENDENT_AMBULATORY_CARE_PROVIDER_SITE_OTHER): Payer: Federal, State, Local not specified - PPO | Admitting: Family Medicine

## 2013-02-24 ENCOUNTER — Encounter: Payer: Self-pay | Admitting: Family Medicine

## 2013-02-24 VITALS — BP 130/90 | HR 76 | Temp 98.5°F | Ht 70.0 in | Wt 257.5 lb

## 2013-02-24 DIAGNOSIS — G5600 Carpal tunnel syndrome, unspecified upper limb: Secondary | ICD-10-CM

## 2013-02-24 DIAGNOSIS — M2550 Pain in unspecified joint: Secondary | ICD-10-CM

## 2013-02-24 DIAGNOSIS — G5601 Carpal tunnel syndrome, right upper limb: Secondary | ICD-10-CM

## 2013-02-24 DIAGNOSIS — B350 Tinea barbae and tinea capitis: Secondary | ICD-10-CM

## 2013-02-24 MED ORDER — KETOCONAZOLE 2 % EX SHAM: 1.0000 "application " | MEDICATED_SHAMPOO | CUTANEOUS | Status: DC

## 2013-02-24 MED ORDER — IBUPROFEN 800 MG PO TABS: 800.0000 mg | ORAL_TABLET | Freq: Three times a day (TID) | ORAL | Status: DC | PRN

## 2013-02-24 MED ORDER — SERTACONAZOLE NITRATE 2 % EX CREA: TOPICAL_CREAM | CUTANEOUS | Status: DC

## 2013-02-24 NOTE — Progress Notes (Signed)
Date:  02/24/2013   Name:  Andrew Stout [facility]   DOB:  05/25/1965   MRN:  [id number] Gender: male Age: 47 y.o.  Primary Physician:  Hannah Beat, MD   Chief Complaint: Hand Pain   Subjective:   History of Present Illness:  Andrew Stout is a 47 y.o. pleasant patient who presents with the following:  CTS: The patent presents a > 3 mo history of numbness and tingling, greatest in medial aspect of hands, some in ulnar aspect. Some weakness with grip strength. Shome occ. pain going to forearm. Bothers the most at night. Aggravated by lifting arms above head and at night.  Both hands. Numb. Losing grip strength.  Splints? Yes, minimal improvement Prior Injecton: none EMG: none Hand of Dominance: R   + rash behind L ear and itchy, in hair line  Motrin helps with joint aches  Patient Active Problem List   Diagnosis Date Noted  . Contact dermatitis 01/01/2013  . Right carpal tunnel syndrome 01/01/2013  . Epiploic appendagitis 08/16/2011  . HYPERTENSION 03/07/2009    Past Medical History  Diagnosis Date  . Hypertension   . Chronic kidney disease     one kidney    Past Surgical History  Procedure Laterality Date  . Tympanostomy tube placement  2010    right  . Nephrostomy  1975    left  . Chest tube insertion    . Vasectomy      History   Social History  . Marital Status: Married    Spouse Name: N/A    Number of Children: N/A  . Years of Education: N/A   Occupational History  . Not on file.   Social History Main Topics  . Smoking status: Never Smoker   . Smokeless tobacco: Never Used  . Alcohol Use: No  . Drug Use: No  . Sexual Activity: Not on file   Other Topics Concern  . Not on file   Social History Narrative   Regular exercise-no    No family history on file.  No Known Allergies  Medication list has been reviewed and updated.  Review of Systems:  GEN: No fevers, chills. Nontoxic. Primarily MSK c/o today. MSK: Detailed  in the HPI GI: tolerating PO intake without difficulty Neuro: No numbness, parasthesias, or tingling associated. Otherwise the pertinent positives of the ROS are noted above.   Objective:   Physical Examination: BP 130/90  Pulse 76  Temp(Src) 98.5 F (36.9 C) (Oral)  Ht 5\' 10"  (1.778 m)  Wt 257 lb 8 oz (116.801 kg)  BMI 36.95 kg/m2  Ideal Body Weight: Weight in (lb) to have BMI = 25: 173.9   GEN: WDWN, NAD, Non-toxic, Alert & Oriented x 3 HEENT: Atraumatic, Normocephalic.  Ears and Nose: No external deformity. EXTR: No clubbing/cyanosis/edema NEURO: Normal gait.  PSYCH: Normally interactive. Conversant. Not depressed or anxious appearing.  Calm demeanor.  SKIN, darker coloration behind left ear and in hair line  Hand: B Ecchymosis or edema: neg ROM wrist/hand/digits/elbow: full  Carpals, MCP's, digits: NT Distal Ulna and Radius: NT Supination lift test: neg Ecchymosis or edema: neg Cysts/nodules: neg Finkelstein's test: neg Snuffbox tenderness: neg Scaphoid tubercle: NT Hook of Hamate: NT Resisted supination: NT Full composite fist Grip, all digits: 5/5 str No tenosynovitis Axial load test: neg Phalen's: + Tinel's: + Atrophy: neg  Hand sensation: intact   No results found.  Assessment & Plan:    Right carpal tunnel syndrome  Tinea capitis  Arthralgia  Carpal Tunnel Syndrome: We discussed the anatomy involved, and that carpal tunnel syndrome primarily involves the median nerve, and this typically affects digits one through 3. We also discussed that mild cases of carpal tunnel syndrome are often improved with night splints, and it is very reasonable to consider a carpal tunnel injection. If the patient does have moderate to severe carpal tunnel syndrome based on NCV, then it is certainly reasonable to consider carpal tunnel release, which was discussed with the patient. We also discussed his severe carpal tunnel syndrome can lead to permanent nerve  impairment even if released. At this point, the patient like to proceed conservatively.   CTS Injection, RIGHT Verbal consent was obtained. Risks (including rare risk of infection), benefits, and alternatives were discussed. Prepped with Chloraprep and Ethyl Chloride used for anesthesia. Under sterile conditions,  the patient was injected just ulnar to the palmaris longus tendon at the wrist flexion crease.  The needle was inserted at 45 degree angle aiming distally. Aspiration showed no blood. Medication flowed freely without resistance.  Needle size: 22 gauge 1 1/2 inch Injection: 1/2 cc of Lidocaine 1% and 1/2 cc of Depo-Medrol 40 mg   New medications, updates to list, dose adjustments: Meds ordered this encounter  Medications  . Sertaconazole Nitrate 2 % CREA    Sig: Apply bid    Dispense:  30 g    Refill:  0  . ketoconazole (NIZORAL) 2 % shampoo    Sig: Apply 1 application topically 2 (two) times a week.    Dispense:  120 mL    Refill:  3  . ibuprofen (ADVIL,MOTRIN) 800 MG tablet    Sig: Take 1 tablet (800 mg total) by mouth every 8 (eight) hours as needed.    Dispense:  90 tablet    Refill:  5    Signed,  Shanise Balch T. Janisa Labus, MD, CAQ Sports Medicine  [facility] [address] [place] [address] Phone: [phone] Fax: [phone]  Updated Complete Medication List:   Medication List       This list is accurate as of: 02/24/13  6:18 PM.  Always use your most recent med list.               ibuprofen 800 MG tablet  Commonly known as:  ADVIL,MOTRIN  Take 1 tablet (800 mg total) by mouth every 8 (eight) hours as needed.     ketoconazole 2 % shampoo  Commonly known as:  NIZORAL  Apply 1 application topically 2 (two) times a week.     multivitamin tablet  Take 1 tablet by mouth daily.     Sertaconazole Nitrate 2 % Crea  Apply bid

## 2013-02-24 NOTE — Progress Notes (Signed)
Pre-visit discussion using our clinic review tool. No additional management support is needed unless otherwise documented below in the visit note.  

## 2013-04-14 ENCOUNTER — Ambulatory Visit (INDEPENDENT_AMBULATORY_CARE_PROVIDER_SITE_OTHER): Payer: Federal, State, Local not specified - PPO | Admitting: Internal Medicine

## 2013-04-14 ENCOUNTER — Encounter: Payer: Self-pay | Admitting: Internal Medicine

## 2013-04-14 VITALS — BP 136/84 | HR 87 | Temp 98.7°F | Wt 257.5 lb

## 2013-04-14 DIAGNOSIS — J019 Acute sinusitis, unspecified: Secondary | ICD-10-CM

## 2013-04-14 MED ORDER — FLUTICASONE PROPIONATE 50 MCG/ACT NA SUSP: 2.0000 | Freq: Every day | NASAL | Status: DC

## 2013-04-14 MED ORDER — AMOXICILLIN 875 MG PO TABS: 875.0000 mg | ORAL_TABLET | Freq: Two times a day (BID) | ORAL | Status: DC

## 2013-04-14 NOTE — Progress Notes (Signed)
HPI: Pt presents with concerns regarding sinus congestion and pressure. Symptoms started five days ago. He endorses sinus congestion with green productive discharge, headache, nose bleeds, sinus pressure, fatigue, and productive cough. Pt denies fever, chills, and chest congestion. Pt tried OTC Mucinex, with some relief, but he stopped due to nose bleeds.     Review of Systems    Past Medical History  Diagnosis Date  . Hypertension   . Chronic kidney disease     one kidney    No family history on file.  History   Social History  . Marital Status: Married    Spouse Name: N/A    Number of Children: N/A  . Years of Education: N/A   Occupational History  . Not on file.   Social History Main Topics  . Smoking status: Never Smoker   . Smokeless tobacco: Never Used  . Alcohol Use: No  . Drug Use: No  . Sexual Activity: Not on file   Other Topics Concern  . Not on file   Social History Narrative   Regular exercise-no    No Known Allergies   Constitutional: Positive headache, fatigue. Denies fever or abrupt weight changes.  HEENT:  Positive eye pain, pressure behind the eyes, facial pain, nasal congestion, nose bleeds, and sore throat. Denies eye redness, ear pain, ringing in the ears, wax buildup, runny nose. Respiratory: Positive cough and thick green sputum production. Denies difficulty breathing or shortness of breath.  Cardiovascular: Denies chest pain, chest tightness, palpitations or swelling in the hands or feet.   No other specific complaints in a complete review of systems (except as listed in HPI above).  Objective:    BP 136/84  Pulse 87  Temp(Src) 98.7 F (37.1 C) (Oral)  Wt 257 lb 8 oz (116.801 kg)  SpO2 98% Wt Readings from Last 3 Encounters:  04/14/13 257 lb 8 oz (116.801 kg)  02/24/13 257 lb 8 oz (116.801 kg)  01/01/13 255 lb 12 oz (116.007 kg)    General: Appears his stated age, well developed, well nourished in NAD. HEENT: Head: normal  shape and size; Eyes: sclera white, no icterus, conjunctiva pink, PERRLA and EOMs intact; Ears: Tm's gray and intact, normal light reflex; Nose: mucosa red/inflamed; frontal tenderness R > L. septum midline; Throat/Mouth: + PND. Teeth present, mucosa pink and moist, no exudate noted, no lesions or ulcerations noted.  Neck: Mild cervical lymphadenopathy. Neck supple, trachea midline. No massses, lumps or thyromegaly present.  Cardiovascular: Normal rate and rhythm. S1,S2 noted.  No murmur, rubs or gallops noted. No JVD or BLE edema. No carotid bruits noted. Pulmonary/Chest: Normal effort and positive vesicular breath sounds. No respiratory distress. No wheezes, rales or ronchi noted.      Assessment & Plan:   Acute bacterial sinusitis  Can use a Neti Pot which can be purchased from your local drug store. Flonase 2 sprays each nostril for 3 days and then as needed. Amoxicillin 875mg  PO BID for 10 days Stop Mucinex  RTC as needed or if symptoms persist.  Demari Gales, Jacques Earthlyourtney S, Student-NP

## 2013-04-14 NOTE — Progress Notes (Signed)
HPI  Pt presents to the clinic today with c.o nose bleeds, cough, headache, and facial pressure. This started about 5 days ago. The cough is productive of thick green mucous. He denies fever but has had fatigue and body aches. He has been trying OTC Mucinex but stopped when he started having nose bleeds. He has no history of allergies or breathing problems.  Review of Systems    Past Medical History  Diagnosis Date  . Hypertension   . Chronic kidney disease     one kidney    No family history on file.  History   Social History  . Marital Status: Married    Spouse Name: N/A    Number of Children: N/A  . Years of Education: N/A   Occupational History  . Not on file.   Social History Main Topics  . Smoking status: Never Smoker   . Smokeless tobacco: Never Used  . Alcohol Use: No  . Drug Use: No  . Sexual Activity: Not on file   Other Topics Concern  . Not on file   Social History Narrative   Regular exercise-no    No Known Allergies   Constitutional: Positive headache, fatigue and fever. Denies fever or  abrupt weight changes.  HEENT:  Positive eye pain, pressure behind the eyes, facial pain, nasal congestion. Denies eye redness, ear pain, ringing in the ears, wax buildup, runny nose or bloody nose. Respiratory: Positive cough and thick green sputum production. Denies difficulty breathing or shortness of breath.  Cardiovascular: Denies chest pain, chest tightness, palpitations or swelling in the hands or feet.   No other specific complaints in a complete review of systems (except as listed in HPI above).  Objective:    BP 136/84  Pulse 87  Temp(Src) 98.7 F (37.1 C) (Oral)  Wt 257 lb 8 oz (116.801 kg)  SpO2 98% Wt Readings from Last 3 Encounters:  04/14/13 257 lb 8 oz (116.801 kg)  02/24/13 257 lb 8 oz (116.801 kg)  01/01/13 255 lb 12 oz (116.007 kg)    General: Appears his stated age, well developed, well nourished in NAD. HEENT: Head: normal shape and  size; Eyes: sclera white, no icterus, conjunctiva pink, PERRLA and EOMs intact; Ears: Tm's gray and intact, normal light reflex; Nose: mucosa pink and dry, septum midline; Throat/Mouth: + PND. Teeth present, mucosa pink and moist, no exudate noted, no lesions or ulcerations noted.  Neck: Neck supple, trachea midline. No massses, lumps or thyromegaly present.  Cardiovascular: Normal rate and rhythm. S1,S2 noted.  No murmur, rubs or gallops noted. No JVD or BLE edema. No carotid bruits noted. Pulmonary/Chest: Normal effort and positive vesicular breath sounds. No respiratory distress. No wheezes, rales or ronchi noted.      Assessment & Plan:   Acute bacterial sinusitis  Can use a Neti Pot which can be purchased from your local drug store. Flonase 2 sprays each nostril for 3 days and then as needed. Augmentin BID for 10 days Stop the mucinex as this seems to be drying you out and causing the nose bleeds  RTC as needed or if symptoms persist.

## 2013-04-14 NOTE — Patient Instructions (Signed)

## 2013-04-14 NOTE — Progress Notes (Signed)
Pre-visit discussion using our clinic review tool. No additional management support is needed unless otherwise documented below in the visit note.  

## 2015-05-08 ENCOUNTER — Other Ambulatory Visit (INDEPENDENT_AMBULATORY_CARE_PROVIDER_SITE_OTHER): Payer: Federal, State, Local not specified - PPO

## 2015-05-08 ENCOUNTER — Encounter: Payer: Federal, State, Local not specified - PPO | Admitting: Family Medicine

## 2015-05-08 DIAGNOSIS — Z125 Encounter for screening for malignant neoplasm of prostate: Secondary | ICD-10-CM

## 2015-05-08 DIAGNOSIS — Z13220 Encounter for screening for lipoid disorders: Secondary | ICD-10-CM | POA: Diagnosis not present

## 2015-05-08 DIAGNOSIS — Z79899 Other long term (current) drug therapy: Secondary | ICD-10-CM

## 2015-05-08 LAB — CBC WITH DIFFERENTIAL/PLATELET
BASOS PCT: 0.5 % (ref 0.0–3.0)
Basophils Absolute: 0 10*3/uL (ref 0.0–0.1)
EOS PCT: 2.4 % (ref 0.0–5.0)
Eosinophils Absolute: 0.2 10*3/uL (ref 0.0–0.7)
HCT: 42.7 % (ref 39.0–52.0)
Hemoglobin: 14.4 g/dL (ref 13.0–17.0)
LYMPHS ABS: 2.1 10*3/uL (ref 0.7–4.0)
Lymphocytes Relative: 23.5 % (ref 12.0–46.0)
MCHC: 33.7 g/dL (ref 30.0–36.0)
MCV: 78.3 fl (ref 78.0–100.0)
MONO ABS: 0.7 10*3/uL (ref 0.1–1.0)
Monocytes Relative: 8.3 % (ref 3.0–12.0)
NEUTROS ABS: 5.8 10*3/uL (ref 1.4–7.7)
NEUTROS PCT: 65.3 % (ref 43.0–77.0)
PLATELETS: 241 10*3/uL (ref 150.0–400.0)
RBC: 5.45 Mil/uL (ref 4.22–5.81)
RDW: 14 % (ref 11.5–15.5)
WBC: 8.9 10*3/uL (ref 4.0–10.5)

## 2015-05-08 LAB — BASIC METABOLIC PANEL
BUN: 12 mg/dL (ref 6–23)
CHLORIDE: 105 meq/L (ref 96–112)
CO2: 28 mEq/L (ref 19–32)
Calcium: 8.9 mg/dL (ref 8.4–10.5)
Creatinine, Ser: 1.21 mg/dL (ref 0.40–1.50)
GFR: 82 mL/min (ref >60.00)
GLUCOSE: 103 mg/dL — AB (ref 70–99)
POTASSIUM: 3.5 meq/L (ref 3.5–5.1)
Sodium: 142 mEq/L (ref 135–145)

## 2015-05-08 LAB — HEPATIC FUNCTION PANEL
ALK PHOS: 63 U/L (ref 39–117)
ALT: 48 U/L (ref 0–53)
AST: 44 U/L — ABNORMAL HIGH (ref 0–37)
Albumin: 4.1 g/dL (ref 3.5–5.2)
BILIRUBIN DIRECT: 0.2 mg/dL (ref 0.0–0.3)
TOTAL PROTEIN: 6.6 g/dL (ref 6.0–8.3)
Total Bilirubin: 0.8 mg/dL (ref 0.2–1.2)

## 2015-05-08 LAB — MICROALBUMIN / CREATININE URINE RATIO
Creatinine,U: 231.5 mg/dL
MICROALB UR: 2.8 mg/dL — AB (ref 0.0–1.9)
Microalb Creat Ratio: 1.2 mg/g (ref 0.0–30.0)

## 2015-05-08 LAB — LIPID PANEL
CHOLESTEROL: 168 mg/dL (ref 0–200)
HDL: 48.9 mg/dL (ref >39.00)
LDL CALC: 106 mg/dL — AB (ref 0–99)
NonHDL: 118.61
TRIGLYCERIDES: 61 mg/dL (ref 0.0–149.0)
Total CHOL/HDL Ratio: 3
VLDL: 12.2 mg/dL (ref 0.0–40.0)

## 2015-05-08 LAB — PSA: PSA: 0.17 ng/mL (ref 0.10–4.00)

## 2015-05-10 ENCOUNTER — Encounter: Payer: Self-pay | Admitting: Family Medicine

## 2015-05-10 ENCOUNTER — Ambulatory Visit (INDEPENDENT_AMBULATORY_CARE_PROVIDER_SITE_OTHER): Payer: Federal, State, Local not specified - PPO | Admitting: Family Medicine

## 2015-05-10 VITALS — BP 126/84 | HR 78 | Temp 98.3°F | Ht 69.25 in | Wt 270.0 lb

## 2015-05-10 DIAGNOSIS — Z0000 Encounter for general adult medical examination without abnormal findings: Secondary | ICD-10-CM | POA: Diagnosis not present

## 2015-05-10 DIAGNOSIS — Z23 Encounter for immunization: Secondary | ICD-10-CM | POA: Diagnosis not present

## 2015-05-10 DIAGNOSIS — Q600 Renal agenesis, unilateral: Secondary | ICD-10-CM | POA: Diagnosis not present

## 2015-05-10 HISTORY — DX: Renal agenesis, unilateral: Q60.0

## 2015-05-10 NOTE — Progress Notes (Signed)
Dr. Frederico Hamman T. Rileigh Kawashima, MD, [facility] [place], [address] Phone: [phone] Fax: [phone]  05/10/2015  Patient: Andrew Stout, MRN: [id number], DOB: Jun 09, 1965, 50 y.o.  Primary Physician:  Owens Loffler, MD   Chief Complaint  Patient presents with  . Annual Exam   Subjective:   Andrew Stout is a 50 y.o. pleasant patient who presents with the following:  Preventative Health Maintenance Visit:  Health Maintenance Summary Reviewed and updated, unless pt declines services.  Tobacco History Reviewed. Alcohol: No concerns, no excessive use Exercise Habits: Some activity, rec at least 30 mins 5 times a week STD concerns: no risk or activity to increase risk Drug Use: None Encouraged self-testicular check  13 pound weight gain  Health Maintenance  Topic Date Due  . HIV Screening  06/04/1981  . INFLUENZA VACCINE  10/17/2015  . TETANUS/TDAP  05/09/2025   Immunization History  Administered Date(s) Administered  . Influenza Split 12/10/2010, 01/06/2012  . Influenza-Unspecified 12/17/2014  . Td 03/18/2005  . Tdap 05/10/2015   Patient Active Problem List   Diagnosis Date Noted  . Solitary kidney, congenital 05/10/2015  . Right carpal tunnel syndrome 01/01/2013  . HYPERTENSION 03/07/2009   Past Medical History  Diagnosis Date  . Hypertension   . Chronic kidney disease     one kidney  . Solitary kidney, congenital 05/10/2015   Past Surgical History  Procedure Laterality Date  . Tympanostomy tube placement  2010    right  . Nephrostomy  1975    left  . Chest tube insertion    . Vasectomy     Social History   Social History  . Marital Status: Married    Spouse Name: N/A  . Number of Children: N/A  . Years of Education: N/A   Occupational History  . Not on file.   Social History Main Topics  . Smoking status: Never Smoker   . Smokeless tobacco: Never Used  . Alcohol Use:  No  . Drug Use: No  . Sexual Activity: Not on file   Other Topics Concern  . Not on file   Social History Narrative   Regular exercise-no   No family history on file. No Known Allergies  Medication list has been reviewed and updated.   General: Denies fever, chills, sweats. No significant weight loss. Eyes: Denies blurring,significant itching ENT: Denies earache, sore throat, and hoarseness. Cardiovascular: Denies chest pains, palpitations, dyspnea on exertion Respiratory: Denies cough, dyspnea at rest,wheeezing Breast: no concerns about lumps GI: Denies nausea, vomiting, diarrhea, constipation, change in bowel habits, abdominal pain, melena, hematochezia GU: Denies penile discharge, ED, urinary flow / outflow problems. No STD concerns. Musculoskeletal: Denies back pain, joint pain Derm: Denies rash, itching Neuro: Denies  paresthesias, frequent falls, frequent headaches Psych: Denies depression, anxiety Endocrine: Denies cold intolerance, heat intolerance, polydipsia Heme: Denies enlarged lymph nodes Allergy: No hayfever  Objective:   BP 126/84 mmHg  Pulse 78  Temp(Src) 98.3 F (36.8 C) (Oral)  Ht 5' 9.25" (1.759 m)  Wt 270 lb (122.471 kg)  BMI 39.58 kg/m2 Ideal Body Weight: Weight in (lb) to have BMI = 25: 170.2  No exam data present  GEN: well developed, well nourished, no acute distress Eyes: conjunctiva and lids normal, PERRLA, EOMI ENT: TM clear, nares clear, oral exam WNL Neck: supple, no lymphadenopathy, no thyromegaly, no JVD Pulm: clear to auscultation and percussion, respiratory effort normal CV: regular rate and rhythm, S1-S2,  no murmur, rub or gallop, no bruits, peripheral pulses normal and symmetric, no cyanosis, clubbing, edema or varicosities GI: soft, non-tender; no hepatosplenomegaly, masses; active bowel sounds all quadrants GU: no hernia, testicular mass, penile discharge Lymph: no cervical, axillary or inguinal adenopathy MSK: gait normal,  muscle tone and strength WNL, no joint swelling, effusions, discoloration, crepitus  SKIN: clear, good turgor, color WNL, no rashes, lesions, or ulcerations Neuro: normal mental status, normal strength, sensation, and motion Psych: alert; oriented to person, place and time, normally interactive and not anxious or depressed in appearance. All labs reviewed with patient.  Lipids:    Component Value Date/Time   CHOL 168 05/08/2015 1440   TRIG 61.0 05/08/2015 1440   HDL 48.90 05/08/2015 1440   LDLDIRECT 163.7 01/06/2012 1537   VLDL 12.2 05/08/2015 1440   CHOLHDL 3 05/08/2015 1440   CBC: CBC Latest Ref Rng 05/08/2015 01/06/2012 08/13/2011  WBC 4.0 - 10.5 K/uL 8.9 9.2 12.1(H)  Hemoglobin 13.0 - 17.0 g/dL 14.4 15.1 14.7  Hematocrit 39.0 - 52.0 % 42.7 45.9 43.9  Platelets 150.0 - 400.0 K/uL 241.0 318.0 197    Basic Metabolic Panel:    Component Value Date/Time   NA 142 05/08/2015 1440   K 3.5 05/08/2015 1440   CL 105 05/08/2015 1440   CO2 28 05/08/2015 1440   BUN 12 05/08/2015 1440   CREATININE 1.21 05/08/2015 1440   GLUCOSE 103* 05/08/2015 1440   CALCIUM 8.9 05/08/2015 1440   Hepatic Function Latest Ref Rng 05/08/2015 12/03/2010 03/07/2009  Total Protein 6.0 - 8.3 g/dL 6.6 6.7 7.0  Albumin 3.5 - 5.2 g/dL 4.1 3.8 3.9  AST 0 - 37 U/L 44(H) 32 40(H)  ALT 0 - 53 U/L 48 19 34  Alk Phosphatase 39 - 117 U/L 63 75 59  Total Bilirubin 0.2 - 1.2 mg/dL 0.8 0.1(L) 0.9  Bilirubin, Direct 0.0 - 0.3 mg/dL 0.2 0.3 0.0    Lab Results  Component Value Date   TSH 1.25 12/03/2010   Lab Results  Component Value Date   PSA 0.17 05/08/2015   PSA 0.26 01/06/2012   PSA 0.32 12/03/2010    Assessment and Plan:   Healthcare maintenance  Need for prophylactic vaccination with combined diphtheria-tetanus-pertussis (DTP) vaccine - Plan: Tdap vaccine greater than or equal to 7yo IM  Solitary kidney, congenital  Health Maintenance Exam: The patient's preventative maintenance and recommended  screening tests for an annual wellness exam were reviewed in full today. Brought up to date unless services declined.  Counselled on the importance of diet, exercise, and its role in overall health and mortality. The patient's FH and SH was reviewed, including their home life, tobacco status, and drug and alcohol status.  Follow-up: No Follow-up on file. Unless noted, follow-up in 1 year for Health Maintenance Exam.  Orders Placed This Encounter  Procedures  . Tdap vaccine greater than or equal to 7yo IM    Signed,  Olman Yono T. Robet Crutchfield, MD   Patient's Medications  New Prescriptions   No medications on file  Previous Medications   FLUTICASONE (FLONASE) 50 MCG/ACT NASAL SPRAY    Place 2 sprays into both nostrils daily.   MULTIPLE VITAMIN (MULTIVITAMIN) TABLET    Take 1 tablet by mouth daily.    Modified Medications   No medications on file  Discontinued Medications   AMOXICILLIN (AMOXIL) 875 MG TABLET    Take 1 tablet (875 mg total) by mouth 2 (two) times daily.   IBUPROFEN (ADVIL,MOTRIN) 800 MG TABLET  Take 1 tablet (800 mg total) by mouth every 8 (eight) hours as needed.   KETOCONAZOLE (NIZORAL) 2 % SHAMPOO    Apply 1 application topically 2 (two) times a week.   SERTACONAZOLE NITRATE 2 % CREA    Apply bid

## 2015-05-10 NOTE — Progress Notes (Signed)
Pre visit review using our clinic review tool, if applicable. No additional management support is needed unless otherwise documented below in the visit note. 

## 2016-01-02 DIAGNOSIS — H6980 Other specified disorders of Eustachian tube, unspecified ear: Secondary | ICD-10-CM | POA: Diagnosis not present

## 2016-01-02 DIAGNOSIS — J340 Abscess, furuncle and carbuncle of nose: Secondary | ICD-10-CM | POA: Diagnosis not present

## 2016-03-27 ENCOUNTER — Ambulatory Visit (INDEPENDENT_AMBULATORY_CARE_PROVIDER_SITE_OTHER): Payer: Federal, State, Local not specified - PPO | Admitting: Family Medicine

## 2016-03-27 ENCOUNTER — Encounter: Payer: Self-pay | Admitting: Family Medicine

## 2016-03-27 VITALS — BP 164/108 | HR 78 | Temp 98.5°F | Ht 69.25 in | Wt 279.8 lb

## 2016-03-27 DIAGNOSIS — J00 Acute nasopharyngitis [common cold]: Secondary | ICD-10-CM

## 2016-03-27 MED ORDER — AMOXICILLIN 500 MG PO CAPS: 1000.0000 mg | ORAL_CAPSULE | Freq: Two times a day (BID) | ORAL | 0 refills | Status: AC

## 2016-03-27 NOTE — Progress Notes (Signed)
Pre visit review using our clinic review tool, if applicable. No additional management support is needed unless otherwise documented below in the visit note. 

## 2016-03-27 NOTE — Progress Notes (Signed)
Dr. Karleen HampshireSpencer T. Takasha Vetere, MD, [facility] [address] [place], [id number] Phone: [phone] Fax: [phone]  03/27/2016  Patient: Andrew CaprioDarwin K Stout, MRN: [id number], DOB: 05/17/1965, 51 y.o.  Primary Physician:  Hannah BeatSpencer Adelaida Reindel, MD   Chief Complaint  Patient presents with  . Nasal Congestion  . Headache  . Epistaxis  . Cough   Subjective:   This 51 y.o. male patient presents with runny nose, sneezing, cough, sore throat, malaise and minimal / low-grade fever .  Coughing, sneezing, wheezing. Coughing really bad.  Arms have been aching. Arms have been sore.  Cough and cold worst with HA and congestion.   ? recent exposure to others with similar symptoms.   The patent denies sore throat as the primary complaint. Denies sthortness of breath/wheezing, high fever, chest pain, rhinits for more than 14 days, significant myalgia, otalgia, facial pain, abdominal pain, changes in bowel or bladder.  PMH, PHS, Allergies, Problem List, Medications, Family History, and Social History have all been reviewed.  Patient Active Problem List   Diagnosis Date Noted  . Solitary kidney, congenital 05/10/2015  . Right carpal tunnel syndrome 01/01/2013  . HYPERTENSION 03/07/2009    Past Medical History:  Diagnosis Date  . Chronic kidney disease    one kidney  . Hypertension   . Solitary kidney, congenital 05/10/2015    Past Surgical History:  Procedure Laterality Date  . CHEST TUBE INSERTION    . NEPHROSTOMY  1975   left  . TYMPANOSTOMY TUBE PLACEMENT  2010   right  . VASECTOMY      Social History   Social History  . Marital status: Married    Spouse name: N/A  . Number of children: N/A  . Years of education: N/A   Occupational History  . Not on file.   Social History Main Topics  . Smoking status: Never Smoker  . Smokeless tobacco: Never Used  . Alcohol use No  . Drug use: No  . Sexual activity: Not on file   Other  Topics Concern  . Not on file   Social History Narrative   Regular exercise-no    No family history on file.  No Known Allergies  Medication list reviewed and updated in full in [facility].  ROS as above, eating and drinking - tolerating PO. Urinating normally. No excessive vomitting or diarrhea. O/w as above.  Objective:   Blood pressure (!) 164/108, pulse 78, temperature 98.5 F (36.9 C), temperature source Oral, height 5' 9.25" (1.759 m), weight 279 lb 12 oz (126.9 kg).  GEN: WDWN, Non-toxic, Atraumatic, normocephalic. A and O x 3. HEENT: Oropharynx clear without exudate, MMM, no significant LAD, mild rhinnorhea Ears: TM clear, COL visualized with good landmarks CV: RRR, no m/g/r. Pulm: CTA B, no wheezes, rhonchi, or crackles, normal respiratory effort. EXT: no c/c/e Psych: well oriented, neither depressed nor anxious in appearance  Objective Data:  Assessment and Plan:   Acute nasopharyngitis  Supportive care reviewed with patient. See patient instruction section. Recheck BP at his cpx  Hold paper Amox script - may be early sinus sx. Trustworthy long-term patient, no need to feel if improving.  Follow-up: No Follow-up on file.   Signed,  Elpidio GaleaSpencer T. Leib Elahi, MD   Patient's Medications  New Prescriptions   AMOXICILLIN (AMOXIL) 500 MG CAPSULE    Take 2 capsules (1,000 mg total) by mouth 2 (two) times daily.  Previous Medications   FLUTICASONE (FLONASE) 50  MCG/ACT NASAL SPRAY    Place 2 sprays into both nostrils daily.   MULTIPLE VITAMIN (MULTIVITAMIN) TABLET    Take 1 tablet by mouth daily.    Modified Medications   No medications on file  Discontinued Medications   No medications on file

## 2016-05-06 ENCOUNTER — Other Ambulatory Visit: Payer: Self-pay | Admitting: Family Medicine

## 2016-05-06 DIAGNOSIS — Z125 Encounter for screening for malignant neoplasm of prostate: Secondary | ICD-10-CM

## 2016-05-06 DIAGNOSIS — Z79899 Other long term (current) drug therapy: Secondary | ICD-10-CM

## 2016-05-06 DIAGNOSIS — E7849 Other hyperlipidemia: Secondary | ICD-10-CM

## 2016-05-07 ENCOUNTER — Other Ambulatory Visit: Payer: Federal, State, Local not specified - PPO

## 2016-05-13 ENCOUNTER — Encounter: Payer: Self-pay | Admitting: Family Medicine

## 2016-05-13 ENCOUNTER — Encounter: Payer: Self-pay | Admitting: *Deleted

## 2016-05-13 ENCOUNTER — Ambulatory Visit (INDEPENDENT_AMBULATORY_CARE_PROVIDER_SITE_OTHER): Payer: Federal, State, Local not specified - PPO | Admitting: Family Medicine

## 2016-05-13 VITALS — BP 140/98 | HR 76 | Temp 98.9°F | Ht 70.0 in | Wt 283.0 lb

## 2016-05-13 DIAGNOSIS — Z0000 Encounter for general adult medical examination without abnormal findings: Secondary | ICD-10-CM | POA: Diagnosis not present

## 2016-05-13 DIAGNOSIS — E7849 Other hyperlipidemia: Secondary | ICD-10-CM

## 2016-05-13 DIAGNOSIS — E784 Other hyperlipidemia: Secondary | ICD-10-CM

## 2016-05-13 DIAGNOSIS — Z79899 Other long term (current) drug therapy: Secondary | ICD-10-CM | POA: Diagnosis not present

## 2016-05-13 DIAGNOSIS — Z125 Encounter for screening for malignant neoplasm of prostate: Secondary | ICD-10-CM | POA: Diagnosis not present

## 2016-05-13 LAB — CBC WITH DIFFERENTIAL/PLATELET
BASOS ABS: 0.1 10*3/uL (ref 0.0–0.1)
Basophils Relative: 0.7 % (ref 0.0–3.0)
Eosinophils Absolute: 0.3 10*3/uL (ref 0.0–0.7)
Eosinophils Relative: 3.6 % (ref 0.0–5.0)
HCT: 44.6 % (ref 39.0–52.0)
Hemoglobin: 15.1 g/dL (ref 13.0–17.0)
LYMPHS ABS: 1.5 10*3/uL (ref 0.7–4.0)
Lymphocytes Relative: 19.1 % (ref 12.0–46.0)
MCHC: 33.9 g/dL (ref 30.0–36.0)
MCV: 76 fl — AB (ref 78.0–100.0)
MONOS PCT: 8.4 % (ref 3.0–12.0)
Monocytes Absolute: 0.6 10*3/uL (ref 0.1–1.0)
NEUTROS ABS: 5.2 10*3/uL (ref 1.4–7.7)
NEUTROS PCT: 68.2 % (ref 43.0–77.0)
PLATELETS: 269 10*3/uL (ref 150.0–400.0)
RBC: 5.87 Mil/uL — ABNORMAL HIGH (ref 4.22–5.81)
RDW: 14.2 % (ref 11.5–15.5)
WBC: 7.7 10*3/uL (ref 4.0–10.5)

## 2016-05-13 LAB — LIPID PANEL
Cholesterol: 183 mg/dL (ref 0–200)
HDL: 44 mg/dL (ref >39.00)
LDL Cholesterol: 120 mg/dL — ABNORMAL HIGH (ref 0–99)
NonHDL: 139.27
Total CHOL/HDL Ratio: 4
Triglycerides: 95 mg/dL (ref 0.0–149.0)
VLDL: 19 mg/dL (ref 0.0–40.0)

## 2016-05-13 LAB — HEPATIC FUNCTION PANEL
ALK PHOS: 68 U/L (ref 39–117)
ALT: 32 U/L (ref 0–53)
AST: 37 U/L (ref 0–37)
Albumin: 4 g/dL (ref 3.5–5.2)
BILIRUBIN TOTAL: 0.7 mg/dL (ref 0.2–1.2)
Bilirubin, Direct: 0.2 mg/dL (ref 0.0–0.3)
Total Protein: 6.6 g/dL (ref 6.0–8.3)

## 2016-05-13 LAB — BASIC METABOLIC PANEL
BUN: 15 mg/dL (ref 6–23)
CALCIUM: 9.3 mg/dL (ref 8.4–10.5)
CO2: 28 meq/L (ref 19–32)
Chloride: 105 mEq/L (ref 96–112)
Creatinine, Ser: 1.25 mg/dL (ref 0.40–1.50)
GFR: 78.65 mL/min (ref >60.00)
GLUCOSE: 104 mg/dL — AB (ref 70–99)
Potassium: 4 mEq/L (ref 3.5–5.1)
SODIUM: 141 meq/L (ref 135–145)

## 2016-05-13 LAB — PSA: PSA: 0.22 ng/mL (ref 0.10–4.00)

## 2016-05-13 MED ORDER — LOSARTAN POTASSIUM 25 MG PO TABS: 25.0000 mg | ORAL_TABLET | Freq: Every day | ORAL | 1 refills | Status: DC

## 2016-05-13 NOTE — Addendum Note (Signed)
Addended by: Alvina ChouWALSH, TERRI J on: 05/13/2016 09:12 AM   Modules accepted: Orders

## 2016-05-13 NOTE — Progress Notes (Signed)
Dr. Frederico Hamman T. Lezli Danek, MD, [facility] [place], [address] Phone: [phone] Fax: [phone]  05/13/2016  Patient: Andrew Stout, MRN: [id number], DOB: [birth year], 51 y.o.  Primary Physician:  Owens Loffler, MD   Chief Complaint  Patient presents with  . Annual Exam   Subjective:   Andrew Stout is a 50 y.o. pleasant patient who presents with the following:  Preventative Health Maintenance Visit:  Health Maintenance Summary Reviewed and updated, unless pt declines services.  Tobacco History Reviewed. Alcohol: No concerns, no excessive use Exercise Habits: Some activity, rec at least 30 mins 5 times a week STD concerns: no risk or activity to increase risk Drug Use: None Encouraged self-testicular check  BP Readings from Last 3 Encounters:  05/13/16 (!) 140/98  03/27/16 (!) 164/108  05/10/15 126/84    Wt Readings from Last 3 Encounters:  05/13/16 283 lb (128.4 kg)  03/27/16 279 lb 12 oz (126.9 kg)  05/10/15 270 lb (122.5 kg)    BP increase and weight gain  Health Maintenance  Topic Date Due  . HIV Screening  06/04/1981  . INFLUENZA VACCINE  06/15/2016 (Originally 10/17/2015)  . TETANUS/TDAP  05/09/2025   Immunization History  Administered Date(s) Administered  . Influenza Split 12/10/2010, 01/06/2012  . Influenza-Unspecified 12/17/2014  . Td 03/18/2005  . Tdap 05/10/2015   Patient Active Problem List   Diagnosis Date Noted  . Solitary kidney, congenital 05/10/2015  . Right carpal tunnel syndrome 01/01/2013  . HYPERTENSION 03/07/2009   Past Medical History:  Diagnosis Date  . Chronic kidney disease    one kidney  . Hypertension   . Solitary kidney, congenital 05/10/2015   Past Surgical History:  Procedure Laterality Date  . CHEST TUBE INSERTION    . NEPHROSTOMY  1975   left  . TYMPANOSTOMY TUBE PLACEMENT  2010   right  . VASECTOMY     Social History   Social  History  . Marital status: Married    Spouse name: N/A  . Number of children: N/A  . Years of education: N/A   Occupational History  . Not on file.   Social History Main Topics  . Smoking status: Never Smoker  . Smokeless tobacco: Never Used  . Alcohol use No  . Drug use: No  . Sexual activity: Not on file   Other Topics Concern  . Not on file   Social History Narrative   Regular exercise-no   No family history on file. No Known Allergies  Medication list has been reviewed and updated.   General: Denies fever, chills, sweats. No significant weight loss. Eyes: Denies blurring,significant itching ENT: Denies earache, sore throat, and hoarseness. Cardiovascular: Denies chest pains, palpitations, dyspnea on exertion Respiratory: Denies cough, dyspnea at rest,wheeezing Breast: no concerns about lumps GI: Denies nausea, vomiting, diarrhea, constipation, change in bowel habits, abdominal pain, melena, hematochezia GU: Denies penile discharge, ED, urinary flow / outflow problems. No STD concerns. Musculoskeletal: Denies back pain, joint pain Derm: Denies rash, itching Neuro: Denies  paresthesias, frequent falls, frequent headaches Psych: Denies depression, anxiety Endocrine: Denies cold intolerance, heat intolerance, polydipsia Heme: Denies enlarged lymph nodes Allergy: No hayfever  Objective:   BP (!) 140/98   Pulse 76   Temp 98.9 F (37.2 C) (Oral)   Ht '5\' 10"'$  (1.778 m)   Wt 283 lb (128.4 kg)   BMI 40.61 kg/m  Ideal Body Weight: Weight in (lb) to have BMI =  25: 173.9  No exam data present  GEN: well developed, well nourished, no acute distress Eyes: conjunctiva and lids normal, PERRLA, EOMI ENT: TM clear, nares clear, oral exam WNL Neck: supple, no lymphadenopathy, no thyromegaly, no JVD Pulm: clear to auscultation and percussion, respiratory effort normal CV: regular rate and rhythm, S1-S2, no murmur, rub or gallop, no bruits, peripheral pulses normal and  symmetric, no cyanosis, clubbing, edema or varicosities GI: soft, non-tender; no hepatosplenomegaly, masses; active bowel sounds all quadrants GU: no hernia, testicular mass, penile discharge Lymph: no cervical, axillary or inguinal adenopathy MSK: gait normal, muscle tone and strength WNL, no joint swelling, effusions, discoloration, crepitus  SKIN: clear, good turgor, color WNL, no rashes, lesions, or ulcerations Neuro: normal mental status, normal strength, sensation, and motion Psych: alert; oriented to person, place and time, normally interactive and not anxious or depressed in appearance.  All labs reviewed with patient.  2018 labs are pending  Lipids:    Component Value Date/Time   CHOL 168 05/08/2015 1440   TRIG 61.0 05/08/2015 1440   HDL 48.90 05/08/2015 1440   LDLDIRECT 163.7 01/06/2012 1537   VLDL 12.2 05/08/2015 1440   CHOLHDL 3 05/08/2015 1440   CBC: CBC Latest Ref Rng & Units 05/08/2015 01/06/2012 08/13/2011  WBC 4.0 - 10.5 K/uL 8.9 9.2 12.1(H)  Hemoglobin 13.0 - 17.0 g/dL 14.4 15.1 14.7  Hematocrit 39.0 - 52.0 % 42.7 45.9 43.9  Platelets 150.0 - 400.0 K/uL 241.0 318.0 542    Basic Metabolic Panel:    Component Value Date/Time   NA 142 05/08/2015 1440   K 3.5 05/08/2015 1440   CL 105 05/08/2015 1440   CO2 28 05/08/2015 1440   BUN 12 05/08/2015 1440   CREATININE 1.21 05/08/2015 1440   GLUCOSE 103 (H) 05/08/2015 1440   CALCIUM 8.9 05/08/2015 1440   Hepatic Function Latest Ref Rng & Units 05/08/2015 12/03/2010 03/07/2009  Total Protein 6.0 - 8.3 g/dL 6.6 6.7 7.0  Albumin 3.5 - 5.2 g/dL 4.1 3.8 3.9  AST 0 - 37 U/L 44(H) 32 40(H)  ALT 0 - 53 U/L 48 19 34  Alk Phosphatase 39 - 117 U/L 63 75 59  Total Bilirubin 0.2 - 1.2 mg/dL 0.8 0.1(L) 0.9  Bilirubin, Direct 0.0 - 0.3 mg/dL 0.2 0.3 0.0    Lab Results  Component Value Date   TSH 1.25 12/03/2010   Lab Results  Component Value Date   PSA 0.17 05/08/2015   PSA 0.26 01/06/2012   PSA 0.32 12/03/2010     Assessment and Plan:   Healthcare maintenance  BP up - restart losartan with 6 mo f/u  Weight loss  Health Maintenance Exam: The patient's preventative maintenance and recommended screening tests for an annual wellness exam were reviewed in full today. Brought up to date unless services declined.  Counselled on the importance of diet, exercise, and its role in overall health and mortality. The patient's FH and SH was reviewed, including their home life, tobacco status, and drug and alcohol status.  Follow-up in 1 year for physical exam or additional follow-up below.  Follow-up: No Follow-up on file. Or follow-up in 1 year if not noted.  Meds ordered this encounter  Medications  . losartan (COZAAR) 25 MG tablet    Sig: Take 1 tablet (25 mg total) by mouth daily.    Dispense:  90 tablet    Refill:  1    Signed,  Sophronia Varney T. Mykeisha Dysert, MD   Allergies as of 05/13/2016  No Known Allergies     Medication List       Accurate as of 05/13/16  9:09 AM. Always use your most recent med list.          fluticasone 50 MCG/ACT nasal spray Commonly known as:  FLONASE Place 2 sprays into both nostrils daily.   losartan 25 MG tablet Commonly known as:  COZAAR Take 1 tablet (25 mg total) by mouth daily.   multivitamin tablet Take 1 tablet by mouth daily.

## 2016-05-13 NOTE — Progress Notes (Signed)
Pre visit review using our clinic review tool, if applicable. No additional management support is needed unless otherwise documented below in the visit note. 

## 2016-10-11 ENCOUNTER — Other Ambulatory Visit: Payer: Self-pay | Admitting: Internal Medicine

## 2016-10-11 ENCOUNTER — Ambulatory Visit (INDEPENDENT_AMBULATORY_CARE_PROVIDER_SITE_OTHER): Payer: Federal, State, Local not specified - PPO | Admitting: Internal Medicine

## 2016-10-11 ENCOUNTER — Encounter: Payer: Self-pay | Admitting: Internal Medicine

## 2016-10-11 VITALS — BP 140/92 | HR 94 | Temp 98.6°F | Wt 277.0 lb

## 2016-10-11 DIAGNOSIS — I10 Essential (primary) hypertension: Secondary | ICD-10-CM

## 2016-10-11 MED ORDER — LOSARTAN POTASSIUM 50 MG PO TABS: 50.0000 mg | ORAL_TABLET | Freq: Every day | ORAL | 0 refills | Status: DC

## 2016-10-11 NOTE — Patient Instructions (Signed)

## 2016-10-11 NOTE — Assessment & Plan Note (Signed)
Uncontrolled Increase Losartan to 50 mg daily Encouraged DASH diet and exercise for weight loss  RTC in 2 weeks for BP check

## 2016-10-11 NOTE — Progress Notes (Signed)
Subjective:    Patient ID: Andrew Stout, male    DOB: Apr 05, 1965, 51 y.o.   MRN: [id number]  HPI  Pt presents to the clinic today for BP check. He reports his BP has been as high as 146/92 at home. He is taking Losartan 25 mg daily as prescribed. His last 3 BP's on file are 140/98, 164/108, 126/84. His BP today is 140/92. He denies dizziness, visual changes, chest tightness or shortness of breath. There is no ECG on file.  Review of Systems      Past Medical History:  Diagnosis Date  . Chronic kidney disease    one kidney  . Hypertension   . Solitary kidney, congenital 05/10/2015    Current Outpatient Prescriptions  Medication Sig Dispense Refill  . fluticasone (FLONASE) 50 MCG/ACT nasal spray Place 2 sprays into both nostrils daily. 16 g 6  . losartan (COZAAR) 25 MG tablet Take 1 tablet (25 mg total) by mouth daily. 90 tablet 1  . Multiple Vitamin (MULTIVITAMIN) tablet Take 1 tablet by mouth daily.       No current facility-administered medications for this visit.     No Known Allergies  No family history on file.  Social History   Social History  . Marital status: Married    Spouse name: N/A  . Number of children: N/A  . Years of education: N/A   Occupational History  . Not on file.   Social History Main Topics  . Smoking status: Never Smoker  . Smokeless tobacco: Never Used  . Alcohol use No  . Drug use: No  . Sexual activity: Not on file   Other Topics Concern  . Not on file   Social History Narrative   Regular exercise-no     Constitutional: Denies fever, malaise, fatigue, headache or abrupt weight changes.  Respiratory: Denies difficulty breathing, shortness of breath, cough or sputum production.   Cardiovascular: Denies chest pain, chest tightness, palpitations or swelling in the hands or feet.  Neurological: Denies dizziness, difficulty with memory, difficulty with speech or problems with balance and coordination.    No other specific  complaints in a complete review of systems (except as listed in HPI above).  Objective:   Physical Exam   BP (!) 140/92   Pulse 94   Temp 98.6 F (37 C) (Oral)   Wt 277 lb (125.6 kg)   SpO2 98%   BMI 39.75 kg/m  Wt Readings from Last 3 Encounters:  10/11/16 277 lb (125.6 kg)  05/13/16 283 lb (128.4 kg)  03/27/16 279 lb 12 oz (126.9 kg)    General: Appears his stated age, obese in NAD. Cardiovascular: Normal rate and rhythm. S1,S2 noted.  No murmur, rubs or gallops noted.  Pulmonary/Chest: Normal effort and positive vesicular breath sounds. No respiratory distress. No wheezes, rales or ronchi noted.  Neurological: Alert and oriented.    BMET    Component Value Date/Time   NA 141 05/13/2016 0912   K 4.0 05/13/2016 0912   CL 105 05/13/2016 0912   CO2 28 05/13/2016 0912   GLUCOSE 104 (H) 05/13/2016 0912   BUN 15 05/13/2016 0912   CREATININE 1.25 05/13/2016 0912   CALCIUM 9.3 05/13/2016 0912   GFRNONAA 68 (L) 08/13/2011 0000   GFRAA 79 (L) 08/13/2011 0000    Lipid Panel     Component Value Date/Time   CHOL 183 05/13/2016 0912   TRIG 95.0 05/13/2016 0912   HDL 44.00 05/13/2016 0912   CHOLHDL 4  05/13/2016 0912   VLDL 19.0 05/13/2016 0912   LDLCALC 120 (H) 05/13/2016 0912    CBC    Component Value Date/Time   WBC 7.7 05/13/2016 0912   RBC 5.87 (H) 05/13/2016 0912   HGB 15.1 05/13/2016 0912   HCT 44.6 05/13/2016 0912   PLT 269.0 05/13/2016 0912   MCV 76.0 (L) 05/13/2016 0912   MCH 26.3 08/13/2011 0000   MCHC 33.9 05/13/2016 0912   RDW 14.2 05/13/2016 0912   LYMPHSABS 1.5 05/13/2016 0912   MONOABS 0.6 05/13/2016 0912   EOSABS 0.3 05/13/2016 0912   BASOSABS 0.1 05/13/2016 0912    Hgb A1C No results found for: HGBA1C         Assessment & Plan:

## 2016-10-28 ENCOUNTER — Encounter: Payer: Self-pay | Admitting: Family Medicine

## 2016-10-28 ENCOUNTER — Ambulatory Visit (INDEPENDENT_AMBULATORY_CARE_PROVIDER_SITE_OTHER): Payer: Federal, State, Local not specified - PPO | Admitting: Family Medicine

## 2016-10-28 VITALS — BP 130/88 | Ht 70.0 in | Wt 277.0 lb

## 2016-10-28 DIAGNOSIS — I10 Essential (primary) hypertension: Secondary | ICD-10-CM

## 2016-10-28 DIAGNOSIS — Q600 Renal agenesis, unilateral: Secondary | ICD-10-CM | POA: Diagnosis not present

## 2016-10-28 NOTE — Progress Notes (Signed)
Dr. Karleen Hampshire T. Lousie Calico, MD, [facility] [address] [place], [address] Phone: [phone] Fax: [phone]  10/28/2016  Patient: Andrew Stout, MRN: [id number], DOB: May 13, 1965, 51 y.o.  Primary Physician:  Hannah Beat, MD   Chief Complaint  Patient presents with  . Follow-up    BP Check   Subjective:   Andrew Stout is a 51 y.o. very pleasant male patient who presents with the following:  Wt Readings from Last 3 Encounters:  10/28/16 277 lb (125.6 kg)  10/11/16 277 lb (125.6 kg)  05/13/16 283 lb (128.4 kg)     HTN: Tolerating all medications without side effects Stable and at goal No CP, no sob. No HA.  He feels much better on his new dose of medication, and he is not having any side effects, and he feels very good.  BP Readings from Last 3 Encounters:  10/28/16 130/88  10/11/16 (!) 140/92  05/13/16 (!) 140/98    Basic Metabolic Panel:    Component Value Date/Time   NA 141 05/13/2016 0912   K 4.0 05/13/2016 0912   CL 105 05/13/2016 0912   CO2 28 05/13/2016 0912   BUN 15 05/13/2016 0912   CREATININE 1.25 05/13/2016 0912   GLUCOSE 104 (H) 05/13/2016 0912   CALCIUM 9.3 05/13/2016 0912     Past Medical History, Surgical History, Social History, Family History, Problem List, Medications, and Allergies have been reviewed and updated if relevant.  Patient Active Problem List   Diagnosis Date Noted  . Solitary kidney, congenital 05/10/2015  . Right carpal tunnel syndrome 01/01/2013  . Essential hypertension 03/07/2009    Past Medical History:  Diagnosis Date  . Chronic kidney disease    one kidney  . Hypertension   . Solitary kidney, congenital 05/10/2015    Past Surgical History:  Procedure Laterality Date  . CHEST TUBE INSERTION    . NEPHROSTOMY  1975   left  . TYMPANOSTOMY TUBE PLACEMENT  2010   right  . VASECTOMY      Social History   Social History  . Marital status:  Married    Spouse name: N/A  . Number of children: N/A  . Years of education: N/A   Occupational History  . Not on file.   Social History Main Topics  . Smoking status: Never Smoker  . Smokeless tobacco: Never Used  . Alcohol use No  . Drug use: No  . Sexual activity: Not on file   Other Topics Concern  . Not on file   Social History Narrative   Regular exercise-no    No family history on file.  No Known Allergies  Medication list reviewed and updated in full in [facility].   GEN: No acute illnesses, no fevers, chills. GI: No n/v/d, eating normally Pulm: No SOB Interactive and getting along well at home.  Otherwise, ROS is as per the HPI.  Objective:   BP 130/88   Ht 5\' 10"  (1.778 m)   Wt 277 lb (125.6 kg)   BMI 39.75 kg/m   GEN: WDWN, NAD, Non-toxic, A & O x 3 HEENT: Atraumatic, Normocephalic. Neck supple. No masses, No LAD. Ears and Nose: No external deformity. CV: RRR, No M/G/R. No JVD. No thrill. No extra heart sounds. PULM: CTA B, no wheezes, crackles, rhonchi. No retractions. No resp. distress. No accessory muscle use. EXTR: No c/c/e NEURO Normal gait.  PSYCH: Normally interactive. Conversant. Not depressed or anxious  appearing.  Calm demeanor.   Laboratory and Imaging Data:  Assessment and Plan:   Essential hypertension  Solitary kidney, congenital  Doing well, stable.  His goal is to lose 40 pounds in the next 2 years.  Follow-up: No Follow-up on file.  Future Appointments Date Time Provider Department Center  04/28/2017 9:00 AM Breland Trouten, Karleen HampshireSpencer, MD [facility]   Signed,  Karleen HampshireSpencer T. Dhiya Smits, MD   Allergies as of 10/28/2016   No Known Allergies     Medication List       Accurate as of 10/28/16 11:59 PM. Always use your most recent med list.          fluticasone 50 MCG/ACT nasal spray Commonly known as:  FLONASE Place 2 sprays into both nostrils daily.   losartan 50 MG tablet Commonly known as:  COZAAR Take  1 tablet (50 mg total) by mouth daily.   multivitamin tablet Take 1 tablet by mouth daily.

## 2016-11-18 ENCOUNTER — Other Ambulatory Visit: Payer: Self-pay | Admitting: Internal Medicine

## 2016-11-19 ENCOUNTER — Other Ambulatory Visit: Payer: Self-pay | Admitting: Family Medicine

## 2016-11-25 ENCOUNTER — Ambulatory Visit: Payer: Federal, State, Local not specified - PPO | Admitting: Family Medicine

## 2017-02-13 ENCOUNTER — Other Ambulatory Visit: Payer: Self-pay | Admitting: Family Medicine

## 2017-04-28 ENCOUNTER — Ambulatory Visit: Payer: Federal, State, Local not specified - PPO | Admitting: Family Medicine

## 2017-04-28 ENCOUNTER — Other Ambulatory Visit: Payer: Self-pay

## 2017-04-28 ENCOUNTER — Encounter: Payer: Self-pay | Admitting: Family Medicine

## 2017-04-28 VITALS — BP 140/90 | HR 74 | Temp 98.7°F | Ht 70.0 in | Wt 281.8 lb

## 2017-04-28 DIAGNOSIS — Z6841 Body Mass Index (BMI) 40.0 and over, adult: Secondary | ICD-10-CM

## 2017-04-28 DIAGNOSIS — I10 Essential (primary) hypertension: Secondary | ICD-10-CM

## 2017-04-28 DIAGNOSIS — G5601 Carpal tunnel syndrome, right upper limb: Secondary | ICD-10-CM | POA: Diagnosis not present

## 2017-04-28 MED ORDER — LOSARTAN POTASSIUM 100 MG PO TABS: 100.0000 mg | ORAL_TABLET | Freq: Every day | ORAL | 1 refills | Status: DC

## 2017-04-28 NOTE — Progress Notes (Signed)
Dr. Karleen Hampshire T. Ramatoulaye Pack, MD, [facility] [address] [place], [address] Phone: [phone] Fax: [phone]  04/28/2017  Patient: Andrew Stout, MRN: [id number], DOB: [birth year], 52 y.o.  Primary Physician:  Hannah Beat, MD   Chief Complaint  Patient presents with  . Follow-up    6 month   Subjective:   Andrew Stout is a 52 y.o. very pleasant male patient who presents with the following:  F/u HTN and weight.  A lot of stress - mom at 15 yo and in hospital a lot.   HTN: Tolerating all medications without side effects Stable and at goal No CP, no sob. No HA.  BP Readings from Last 3 Encounters:  04/28/17 140/90  10/28/16 130/88  10/11/16 (!) 140/92    Basic Metabolic Panel:    Component Value Date/Time   NA 141 05/13/2016 0912   K 4.0 05/13/2016 0912   CL 105 05/13/2016 0912   CO2 28 05/13/2016 0912   BUN 15 05/13/2016 0912   CREATININE 1.25 05/13/2016 0912   GLUCOSE 104 (H) 05/13/2016 0912   CALCIUM 9.3 05/13/2016 0912    Wt Readings from Last 3 Encounters:  04/28/17 281 lb 12 oz (127.8 kg)  10/28/16 277 lb (125.6 kg)  10/11/16 277 lb (125.6 kg)    He had a goal of 40 lb weight loss - now up several.   Past Medical History, Surgical History, Social History, Family History, Problem List, Medications, and Allergies have been reviewed and updated if relevant.  Patient Active Problem List   Diagnosis Date Noted  . Solitary kidney, congenital 05/10/2015  . Right carpal tunnel syndrome 01/01/2013  . Essential hypertension 03/07/2009    Past Medical History:  Diagnosis Date  . Chronic kidney disease    one kidney  . Hypertension   . Solitary kidney, congenital 05/10/2015    Past Surgical History:  Procedure Laterality Date  . CHEST TUBE INSERTION    . NEPHROSTOMY  1975   left  . TYMPANOSTOMY TUBE PLACEMENT  2010   right  . VASECTOMY      Social History   Socioeconomic History   . Marital status: Married    Spouse name: Not on file  . Number of children: Not on file  . Years of education: Not on file  . Highest education level: Not on file  Social Needs  . Financial resource strain: Not on file  . Food insecurity - worry: Not on file  . Food insecurity - inability: Not on file  . Transportation needs - medical: Not on file  . Transportation needs - non-medical: Not on file  Occupational History  . Not on file  Tobacco Use  . Smoking status: Never Smoker  . Smokeless tobacco: Never Used  Substance and Sexual Activity  . Alcohol use: No  . Drug use: No  . Sexual activity: Not on file  Other Topics Concern  . Not on file  Social History Narrative   Regular exercise-no    History reviewed. No pertinent family history.  No Known Allergies  Medication list reviewed and updated in full in [facility].   GEN: No acute illnesses, no fevers, chills. GI: No n/v/d, eating normally Pulm: No SOB Interactive and getting along well at home.  Otherwise, ROS is as per the HPI.  Objective:   BP 140/90   Pulse 74   Temp 98.7 F (37.1 C) (Oral)   Ht 5'  10" (1.778 m)   Wt 281 lb 12 oz (127.8 kg)   BMI 40.43 kg/m   GEN: WDWN, NAD, Non-toxic, A & O x 3 HEENT: Atraumatic, Normocephalic. Neck supple. No masses, No LAD. Ears and Nose: No external deformity. CV: RRR, No M/G/R. No JVD. No thrill. No extra heart sounds. PULM: CTA B, no wheezes, crackles, rhonchi. No retractions. No resp. distress. No accessory muscle use. EXTR: No c/c/e NEURO Normal gait.  PSYCH: Normally interactive. Conversant. Not depressed or anxious appearing.  Calm demeanor.   Laboratory and Imaging Data:  Assessment and Plan:   Essential hypertension  Right carpal tunnel syndrome  Morbid obesity with BMI of 40.0-44.9, adult (HCC)  140/90 BP with solitary kidney and weight gain, increase losartan to 100 mg a day  Follow-up: Return in about 6 months (around 10/26/2017)  for complete physical.  Meds ordered this encounter  Medications  . losartan (COZAAR) 100 MG tablet    Sig: Take 1 tablet (100 mg total) by mouth daily.    Dispense:  90 tablet    Refill:  1   Medications Discontinued During This Encounter  Medication Reason  . losartan (COZAAR) 50 MG tablet    Signed,  Krystal Delduca T. Daneshia Tavano, MD   Allergies as of 04/28/2017   No Known Allergies     Medication List        Accurate as of 04/28/17  9:48 AM. Always use your most recent med list.          fluticasone 50 MCG/ACT nasal spray Commonly known as:  FLONASE Place 2 sprays into both nostrils daily.   losartan 100 MG tablet Commonly known as:  COZAAR Take 1 tablet (100 mg total) by mouth daily.   multivitamin tablet Take 1 tablet by mouth daily.

## 2017-10-17 ENCOUNTER — Other Ambulatory Visit: Payer: Self-pay | Admitting: Family Medicine

## 2017-10-21 ENCOUNTER — Other Ambulatory Visit: Payer: Self-pay | Admitting: Family Medicine

## 2017-10-21 DIAGNOSIS — Z79899 Other long term (current) drug therapy: Secondary | ICD-10-CM

## 2017-10-21 DIAGNOSIS — Z125 Encounter for screening for malignant neoplasm of prostate: Secondary | ICD-10-CM

## 2017-10-21 DIAGNOSIS — E785 Hyperlipidemia, unspecified: Secondary | ICD-10-CM

## 2017-10-27 ENCOUNTER — Other Ambulatory Visit (INDEPENDENT_AMBULATORY_CARE_PROVIDER_SITE_OTHER): Payer: Federal, State, Local not specified - PPO

## 2017-10-27 DIAGNOSIS — Z125 Encounter for screening for malignant neoplasm of prostate: Secondary | ICD-10-CM | POA: Diagnosis not present

## 2017-10-27 DIAGNOSIS — Z79899 Other long term (current) drug therapy: Secondary | ICD-10-CM

## 2017-10-27 DIAGNOSIS — E785 Hyperlipidemia, unspecified: Secondary | ICD-10-CM | POA: Diagnosis not present

## 2017-10-27 LAB — HEPATIC FUNCTION PANEL
ALBUMIN: 4.1 g/dL (ref 3.5–5.2)
ALK PHOS: 71 U/L (ref 39–117)
ALT: 20 U/L (ref 0–53)
AST: 25 U/L (ref 0–37)
BILIRUBIN DIRECT: 0.2 mg/dL (ref 0.0–0.3)
TOTAL PROTEIN: 6.9 g/dL (ref 6.0–8.3)
Total Bilirubin: 0.7 mg/dL (ref 0.2–1.2)

## 2017-10-27 LAB — BASIC METABOLIC PANEL
BUN: 16 mg/dL (ref 6–23)
CALCIUM: 9.3 mg/dL (ref 8.4–10.5)
CHLORIDE: 104 meq/L (ref 96–112)
CO2: 30 meq/L (ref 19–32)
Creatinine, Ser: 1.4 mg/dL (ref 0.40–1.50)
GFR: 68.6 mL/min (ref >60.00)
GLUCOSE: 116 mg/dL — AB (ref 70–99)
Potassium: 4.1 mEq/L (ref 3.5–5.1)
SODIUM: 140 meq/L (ref 135–145)

## 2017-10-27 LAB — CBC WITH DIFFERENTIAL/PLATELET
BASOS ABS: 0 10*3/uL (ref 0.0–0.1)
Basophils Relative: 0.5 % (ref 0.0–3.0)
EOS ABS: 0.2 10*3/uL (ref 0.0–0.7)
Eosinophils Relative: 2.4 % (ref 0.0–5.0)
HCT: 42.9 % (ref 39.0–52.0)
Hemoglobin: 14.5 g/dL (ref 13.0–17.0)
LYMPHS ABS: 1.5 10*3/uL (ref 0.7–4.0)
Lymphocytes Relative: 19.7 % (ref 12.0–46.0)
MCHC: 33.7 g/dL (ref 30.0–36.0)
MCV: 77 fl — ABNORMAL LOW (ref 78.0–100.0)
MONO ABS: 0.7 10*3/uL (ref 0.1–1.0)
Monocytes Relative: 8.8 % (ref 3.0–12.0)
NEUTROS ABS: 5.1 10*3/uL (ref 1.4–7.7)
NEUTROS PCT: 68.6 % (ref 43.0–77.0)
PLATELETS: 288 10*3/uL (ref 150.0–400.0)
RBC: 5.58 Mil/uL (ref 4.22–5.81)
RDW: 14.2 % (ref 11.5–15.5)
WBC: 7.5 10*3/uL (ref 4.0–10.5)

## 2017-10-27 LAB — LIPID PANEL
CHOLESTEROL: 188 mg/dL (ref 0–200)
HDL: 41 mg/dL (ref >39.00)
LDL Cholesterol: 128 mg/dL — ABNORMAL HIGH (ref 0–99)
NonHDL: 146.97
TRIGLYCERIDES: 97 mg/dL (ref 0.0–149.0)
Total CHOL/HDL Ratio: 5
VLDL: 19.4 mg/dL (ref 0.0–40.0)

## 2017-10-27 LAB — PSA: PSA: 0.39 ng/mL (ref 0.10–4.00)

## 2017-11-03 ENCOUNTER — Ambulatory Visit (INDEPENDENT_AMBULATORY_CARE_PROVIDER_SITE_OTHER): Payer: Federal, State, Local not specified - PPO | Admitting: Family Medicine

## 2017-11-03 ENCOUNTER — Encounter: Payer: Self-pay | Admitting: Family Medicine

## 2017-11-03 ENCOUNTER — Encounter: Payer: Self-pay | Admitting: Gastroenterology

## 2017-11-03 VITALS — BP 130/90 | HR 80 | Temp 98.7°F | Ht 69.75 in | Wt 283.0 lb

## 2017-11-03 DIAGNOSIS — Z0000 Encounter for general adult medical examination without abnormal findings: Secondary | ICD-10-CM

## 2017-11-03 DIAGNOSIS — Z1211 Encounter for screening for malignant neoplasm of colon: Secondary | ICD-10-CM

## 2017-11-03 NOTE — Patient Instructions (Signed)
REFERRALS TO SPECIALISTS, SPECIAL TESTS (MRI, CT, ULTRASOUNDS)  MARION or  Anastasiya will help you. ASK CHECK-IN FOR HELP.  Specialist appointment times vary a great deal, based on their schedule / openings. -- Some specialists have very long wait times. (Example. Dermatology)    

## 2017-11-03 NOTE — Progress Notes (Signed)
Dr. Frederico Hamman T. Tanecia Mccay, MD, [facility] [place], [address] Phone: [phone] Fax: [phone]  11/03/2017  Patient: Andrew Stout, MRN: [id number], DOB: 14-Nov-1965, 52 y.o.  Primary Physician:  Owens Loffler, MD   Chief Complaint  Patient presents with  . Annual Exam   Subjective:   Andrew Stout is a 52 y.o. pleasant patient who presents with the following:  Preventative Health Maintenance Visit:  Health Maintenance Summary Reviewed and updated, unless pt declines services.  Tobacco History Reviewed. Alcohol: No concerns, no excessive use Exercise Habits: Some activity, rec at least 30 mins 5 times a week - 10, - 15,000 steps at work a day STD concerns: no risk or activity to increase risk Drug Use: None Encouraged self-testicular check  Just went back - cruise ship out of [place]  Colonoscopy.    Health Maintenance  Topic Date Due  . HIV Screening  06/04/1981  . COLONOSCOPY  06/04/2016  . INFLUENZA VACCINE  10/16/2017  . TETANUS/TDAP  05/09/2025   Immunization History  Administered Date(s) Administered  . Influenza Split 12/10/2010, 01/06/2012  . Influenza-Unspecified 12/17/2014, 02/14/2017  . Td 03/18/2005  . Tdap 05/10/2015   Patient Active Problem List   Diagnosis Date Noted  . Solitary kidney, congenital 05/10/2015  . Right carpal tunnel syndrome 01/01/2013  . Essential hypertension 03/07/2009   Past Medical History:  Diagnosis Date  . Chronic kidney disease    one kidney  . Hypertension   . Solitary kidney, congenital 05/10/2015   Past Surgical History:  Procedure Laterality Date  . CHEST TUBE INSERTION    . NEPHROSTOMY  1975   left  . TYMPANOSTOMY TUBE PLACEMENT  2010   right  . VASECTOMY     Social History   Socioeconomic History  . Marital status: Married    Spouse name: Not on file  . Number of children: Not on file  . Years of education: Not  on file  . Highest education level: Not on file  Occupational History  . Not on file  Social Needs  . Financial resource strain: Not on file  . Food insecurity:    Worry: Not on file    Inability: Not on file  . Transportation needs:    Medical: Not on file    Non-medical: Not on file  Tobacco Use  . Smoking status: Never Smoker  . Smokeless tobacco: Never Used  Substance and Sexual Activity  . Alcohol use: No  . Drug use: No  . Sexual activity: Not on file  Lifestyle  . Physical activity:    Days per week: Not on file    Minutes per session: Not on file  . Stress: Not on file  Relationships  . Social connections:    Talks on phone: Not on file    Gets together: Not on file    Attends religious service: Not on file    Active member of club or organization: Not on file    Attends meetings of clubs or organizations: Not on file    Relationship status: Not on file  . Intimate partner violence:    Fear of current or ex partner: Not on file    Emotionally abused: Not on file    Physically abused: Not on file    Forced sexual activity: Not on file  Other Topics Concern  . Not on file  Social History Narrative   Regular exercise-no   History  reviewed. No pertinent family history. No Known Allergies  Medication list has been reviewed and updated.   General: Denies fever, chills, sweats. No significant weight loss. Eyes: Denies blurring,significant itching ENT: Denies earache, sore throat, and hoarseness. Cardiovascular: Denies chest pains, palpitations, dyspnea on exertion Respiratory: Denies cough, dyspnea at rest,wheeezing Breast: no concerns about lumps GI: Denies nausea, vomiting, diarrhea, constipation, change in bowel habits, abdominal pain, melena, hematochezia GU: Denies penile discharge, ED, urinary flow / outflow problems. No STD concerns. Musculoskeletal: Denies back pain, joint pain Derm: Denies rash, itching Neuro: Denies  paresthesias, frequent falls,  frequent headaches Psych: Denies depression, anxiety Endocrine: Denies cold intolerance, heat intolerance, polydipsia Heme: Denies enlarged lymph nodes Allergy: No hayfever  Objective:   BP 130/90   Pulse 80   Temp 98.7 F (37.1 C) (Oral)   Ht 5' 9.75" (1.772 m)   Wt 283 lb (128.4 kg)   BMI 40.90 kg/m  Ideal Body Weight: Weight in (lb) to have BMI = 25: 172.6  No exam data present  GEN: well developed, well nourished, no acute distress Eyes: conjunctiva and lids normal, PERRLA, EOMI ENT: TM clear, nares clear, oral exam WNL Neck: supple, no lymphadenopathy, no thyromegaly, no JVD Pulm: clear to auscultation and percussion, respiratory effort normal CV: regular rate and rhythm, S1-S2, no murmur, rub or gallop, no bruits, peripheral pulses normal and symmetric, no cyanosis, clubbing, edema or varicosities GI: soft, non-tender; no hepatosplenomegaly, masses; active bowel sounds all quadrants GU: no hernia, testicular mass, penile discharge Lymph: no cervical, axillary or inguinal adenopathy MSK: gait normal, muscle tone and strength WNL, no joint swelling, effusions, discoloration, crepitus  SKIN: clear, good turgor, color WNL, no rashes, lesions, or ulcerations Neuro: normal mental status, normal strength, sensation, and motion Psych: alert; oriented to person, place and time, normally interactive and not anxious or depressed in appearance. All labs reviewed with patient.  Lipids:    Component Value Date/Time   CHOL 188 10/27/2017 0902   TRIG 97.0 10/27/2017 0902   HDL 41.00 10/27/2017 0902   LDLDIRECT 163.7 01/06/2012 1537   VLDL 19.4 10/27/2017 0902   CHOLHDL 5 10/27/2017 0902   CBC: CBC Latest Ref Rng & Units 10/27/2017 05/13/2016 05/08/2015  WBC 4.0 - 10.5 K/uL 7.5 7.7 8.9  Hemoglobin 13.0 - 17.0 g/dL 14.5 15.1 14.4  Hematocrit 39.0 - 52.0 % 42.9 44.6 42.7  Platelets 150.0 - 400.0 K/uL 288.0 269.0 301.6    Basic Metabolic Panel:    Component Value Date/Time   NA  140 10/27/2017 0902   K 4.1 10/27/2017 0902   CL 104 10/27/2017 0902   CO2 30 10/27/2017 0902   BUN 16 10/27/2017 0902   CREATININE 1.40 10/27/2017 0902   GLUCOSE 116 (H) 10/27/2017 0902   CALCIUM 9.3 10/27/2017 0902   Hepatic Function Latest Ref Rng & Units 10/27/2017 05/13/2016 05/08/2015  Total Protein 6.0 - 8.3 g/dL 6.9 6.6 6.6  Albumin 3.5 - 5.2 g/dL 4.1 4.0 4.1  AST 0 - 37 U/L 25 37 44(H)  ALT 0 - 53 U/L 20 32 48  Alk Phosphatase 39 - 117 U/L 71 68 63  Total Bilirubin 0.2 - 1.2 mg/dL 0.7 0.7 0.8  Bilirubin, Direct 0.0 - 0.3 mg/dL 0.2 0.2 0.2    Lab Results  Component Value Date   TSH 1.25 12/03/2010   Lab Results  Component Value Date   PSA 0.39 10/27/2017   PSA 0.22 05/13/2016   PSA 0.17 05/08/2015    Assessment and Plan:  Healthcare maintenance  Health Maintenance Exam: The patient's preventative maintenance and recommended screening tests for an annual wellness exam were reviewed in full today. Brought up to date unless services declined.  Counselled on the importance of diet, exercise, and its role in overall health and mortality. The patient's FH and SH was reviewed, including their home life, tobacco status, and drug and alcohol status.  Follow-up in 1 year for physical exam or additional follow-up below.  Follow-up: No follow-ups on file. Or follow-up in 1 year if not noted.  Signed,  Maud Deed. Icie Kuznicki, MD   Allergies as of 11/03/2017   No Known Allergies     Medication List        Accurate as of 11/03/17  9:40 AM. Always use your most recent med list.          fluticasone 50 MCG/ACT nasal spray Commonly known as:  FLONASE Place 2 sprays into both nostrils daily.   losartan 100 MG tablet Commonly known as:  COZAAR TAKE 1 TABLET(100 MG) BY MOUTH DAILY   multivitamin tablet Take 1 tablet by mouth daily.

## 2017-12-01 ENCOUNTER — Telehealth: Payer: Self-pay | Admitting: Family Medicine

## 2017-12-01 NOTE — Telephone Encounter (Signed)
Patient questioning if he can take a low dose aspirin with his BP medicine. Patient takes losartan (COZAAR) 100 MG tablet . Please advise: CB# [phone]

## 2017-12-01 NOTE — Telephone Encounter (Signed)
Yes, if he woud like. 81 mg coated.

## 2017-12-01 NOTE — Telephone Encounter (Signed)
Copied from CRM [phone]#[id number]. Topic: Quick Communication - See Telephone Encounter >> Dec 01, 2017  3:38 PM Tamela OddiMartin, Andrew, NT wrote: CRM for notification. See Telephone encounter for: 12/01/17. Patient called and states is it ok for him to take a low dose aspirin with his BP medicine. Patient takes losartan (COZAAR) 100 MG tablet . Please call patient CB# [phone]

## 2017-12-02 NOTE — Telephone Encounter (Signed)
Mr. Andrew Stout notified as instructed by telephone.

## 2017-12-19 ENCOUNTER — Encounter: Payer: Self-pay | Admitting: Gastroenterology

## 2017-12-19 ENCOUNTER — Ambulatory Visit (AMBULATORY_SURGERY_CENTER): Payer: Self-pay

## 2017-12-19 VITALS — Ht 70.0 in | Wt 280.8 lb

## 2017-12-19 DIAGNOSIS — Z1211 Encounter for screening for malignant neoplasm of colon: Secondary | ICD-10-CM

## 2017-12-19 NOTE — Progress Notes (Signed)
Per pt, no allergies to soy or egg products.Pt not taking any weight loss meds or using  O2 at home.  Pt refused emmi video. 

## 2018-01-02 ENCOUNTER — Encounter: Payer: Self-pay | Admitting: Gastroenterology

## 2018-01-02 ENCOUNTER — Ambulatory Visit (AMBULATORY_SURGERY_CENTER): Payer: Federal, State, Local not specified - PPO | Admitting: Gastroenterology

## 2018-01-02 VITALS — BP 119/77 | HR 84 | Temp 98.6°F | Resp 13 | Ht 69.5 in | Wt 283.0 lb

## 2018-01-02 DIAGNOSIS — Z1211 Encounter for screening for malignant neoplasm of colon: Secondary | ICD-10-CM | POA: Diagnosis not present

## 2018-01-02 MED ORDER — SODIUM CHLORIDE 0.9 % IV SOLN: 500.0000 mL | Freq: Once | INTRAVENOUS | Status: DC

## 2018-01-02 NOTE — Progress Notes (Signed)
Pt. States no change in history since previsit 12/19/17

## 2018-01-02 NOTE — Progress Notes (Signed)
Report given to PACU, vss 

## 2018-01-02 NOTE — Op Note (Signed)
[facility] Patient Name: Andrew Stout Procedure Date: 01/02/2018 9:48 AM MRN: [id number] Endoscopist: Tressia Danas MD, MD Age: 52 Referring MD:  Date of Birth: [birth year] Gender: Male Account #: [id number] Procedure:                Colonoscopy Indications:              Screening for colorectal malignant neoplasm. There                            is no known family history of colon cancer or                            polyps. No baseline GI symptoms. Medicines:                See the Anesthesia note for documentation of the                            administered medications Procedure:                Pre-Anesthesia Assessment:                           - Prior to the procedure, a History and Physical                            was performed, and patient medications and                            allergies were reviewed. The patient's tolerance of                            previous anesthesia was also reviewed. The risks                            and benefits of the procedure and the sedation                            options and risks were discussed with the patient.                            All questions were answered, and informed consent                            was obtained. Prior Anticoagulants: The patient has                            taken no previous anticoagulant or antiplatelet                            agents. ASA Grade Assessment: II - A patient with                            mild systemic disease. After reviewing the risks  and benefits, the patient was deemed in                            satisfactory condition to undergo the procedure.                           After obtaining informed consent, the colonoscope                            was passed under direct vision. Throughout the                            procedure, the patient's blood pressure, pulse, and                            oxygen saturations were  monitored continuously. The                            Colonoscope was introduced through the anus and                            advanced to the the terminal ileum, with                            identification of the appendiceal orifice and IC                            valve. The colonoscopy was performed without                            difficulty. The patient tolerated the procedure                            well. The quality of the bowel preparation was good. Scope In: 9:52:46 AM Scope Out: 10:03:31 AM Scope Withdrawal Time: 0 hours 7 minutes 39 seconds  Total Procedure Duration: 0 hours 10 minutes 45 seconds  Findings:                 The perianal and digital rectal examinations were                            normal.                           The entire examined colon appeared normal on direct                            and retroflexion views. Complications:            No immediate complications. Estimated Blood Loss:     Estimated blood loss: none. Impression:               - The entire examined colon is normal on direct and  retroflexion views.                           - No specimens collected. Recommendation:           - Discharge patient to home.                           - Resume regular diet today.                           - Continue present medications.                           - Repeat colonoscopy in 10 years for screening                            purposes. Current guidelines suggest a follow-up                            colonoscopy in 10 years. However, please return                            earlier with the development of any new                            gastrointestinal symptoms. Tressia Danas MD, MD 01/02/2018 10:07:38 AM This report has been signed electronically.

## 2018-01-02 NOTE — Patient Instructions (Addendum)
YOU HAD AN ENDOSCOPIC PROCEDURE TODAY AT THE [facility]:   Refer to the procedure report that was given to you for any specific questions about what was found during the examination.  If the procedure report does not answer your questions, please call your gastroenterologist to clarify.  If you requested that your care partner not be given the details of your procedure findings, then the procedure report has been included in a sealed envelope for you to review at your convenience later.  YOU SHOULD EXPECT: Some feelings of bloating in the abdomen. Passage of more gas than usual.  Walking can help get rid of the air that was put into your GI tract during the procedure and reduce the bloating. If you had a lower endoscopy (such as a colonoscopy or flexible sigmoidoscopy) you may notice spotting of blood in your stool or on the toilet paper. If you underwent a bowel prep for your procedure, you may not have a normal bowel movement for a few days.  Please Note:  You might notice some irritation and congestion in your nose or some drainage.  This is from the oxygen used during your procedure.  There is no need for concern and it should clear up in a day or so.  SYMPTOMS TO REPORT IMMEDIATELY:   Following lower endoscopy (colonoscopy or flexible sigmoidoscopy):  Excessive amounts of blood in the stool  Significant tenderness or worsening of abdominal pains  Swelling of the abdomen that is new, acute  Fever of 100F or higher  For urgent or emergent issues, a gastroenterologist can be reached at any hour by calling [phone].   DIET:  We do recommend a small meal at first, but then you may proceed to your regular diet.  Drink plenty of fluids but you should avoid alcoholic beverages for 24 hours.  ACTIVITY:  You should plan to take it easy for the rest of today and you should NOT DRIVE or use heavy machinery until tomorrow (because of the sedation medicines used during the test).     FOLLOW UP: Our staff will call the number listed on your records the next business day following your procedure to check on you and address any questions or concerns that you may have regarding the information given to you following your procedure. If we do not reach you, we will leave a message.  However, if you are feeling well and you are not experiencing any problems, there is no need to return our call.  We will assume that you have returned to your regular daily activities without incident.  If any biopsies were taken you will be contacted by phone or by letter within the next 1-3 weeks.  Please call us at [phone] if you have not heard about the biopsies in 3 weeks.    SIGNATURES/CONFIDENTIALITY: You and/or your care partner have signed paperwork which will be entered into your electronic medical record.  These signatures attest to the fact that that the information above on your After Visit Summary has been reviewed and is understood.  Full responsibility of the confidentiality of this discharge information lies with you and/or your care-partner. 

## 2018-01-05 ENCOUNTER — Telehealth: Payer: Self-pay | Admitting: *Deleted

## 2018-01-05 ENCOUNTER — Telehealth: Payer: Self-pay

## 2018-01-05 NOTE — Telephone Encounter (Signed)
No answer for post procedure call back. Left message and will attempt to call back later this afternoon. SM 

## 2018-01-05 NOTE — Telephone Encounter (Signed)
Second post procedure follow up call, no answer 

## 2018-01-14 ENCOUNTER — Other Ambulatory Visit: Payer: Self-pay | Admitting: Family Medicine

## 2018-01-17 DIAGNOSIS — M545 Low back pain: Secondary | ICD-10-CM | POA: Diagnosis not present

## 2018-01-20 ENCOUNTER — Ambulatory Visit: Payer: Federal, State, Local not specified - PPO | Admitting: Internal Medicine

## 2018-01-20 ENCOUNTER — Encounter: Payer: Self-pay | Admitting: Internal Medicine

## 2018-01-20 VITALS — BP 132/84 | HR 73 | Temp 98.0°F | Wt 287.0 lb

## 2018-01-20 DIAGNOSIS — R109 Unspecified abdominal pain: Secondary | ICD-10-CM | POA: Diagnosis not present

## 2018-01-20 DIAGNOSIS — M549 Dorsalgia, unspecified: Secondary | ICD-10-CM | POA: Diagnosis not present

## 2018-01-20 DIAGNOSIS — Q600 Renal agenesis, unilateral: Secondary | ICD-10-CM

## 2018-01-20 LAB — POC URINALSYSI DIPSTICK (AUTOMATED)
Bilirubin, UA: NEGATIVE
GLUCOSE UA: NEGATIVE
Ketones, UA: NEGATIVE
LEUKOCYTES UA: NEGATIVE
Nitrite, UA: NEGATIVE
PH UA: 6 (ref 5.0–8.0)
Protein, UA: NEGATIVE
RBC UA: NEGATIVE
Spec Grav, UA: 1.015 (ref 1.010–1.025)
UROBILINOGEN UA: 0.2 U/dL

## 2018-01-22 ENCOUNTER — Encounter: Payer: Self-pay | Admitting: Internal Medicine

## 2018-01-22 NOTE — Progress Notes (Signed)
Subjective:    Patient ID: Andrew Stout, male    DOB: 1965/10/26, 52 y.o.   MRN: [id number]  HPI  Patient presents to the clinic today for follow-up of right low back and right flank pain.  He reports this started 2 weeks ago.  He describes the pain as sore and achy.  The pain is worse with certain movements.  The pain radiates into the shoulder.  He denies numbness, tingling or weakness of the right arm.  He went to [facility] for evaluation of the same.  He repeated x-rays of his back which were normal.  He was prescribed Diclofenac and Flexeril which he has been taking with some improvement.  He was advised to follow-up with his PCP to make sure this was not a kidney stone.  He denies urgency, frequency, dysuria or blood in his urine.  He denies any abdominal pain.  He has no history of kidney. Stones. He has a history of congenital solitary kidney.  Review of Systems  Past Medical History:  Diagnosis Date  . Allergy    seasonal  . Chronic kidney disease    one kidney  . Hypertension   . Solitary kidney, congenital 05/10/2015    Current Outpatient Medications  Medication Sig Dispense Refill  . cyclobenzaprine (FLEXERIL) 5 MG tablet TK 1 T PO QHS PRF SPASM  0  . diclofenac (VOLTAREN) 75 MG EC tablet TK 1 T PO BID WF FOR 2 WKS THEN PRF PAIN / SWELLING  1  . fluticasone (FLONASE) 50 MCG/ACT nasal spray Place 2 sprays into both nostrils daily. (Patient taking differently: Place 2 sprays into both nostrils as needed. ) 16 g 6  . losartan (COZAAR) 100 MG tablet TAKE 1 TABLET(100 MG) BY MOUTH DAILY 90 tablet 1  . Multiple Vitamin (MULTIVITAMIN) tablet Take 1 tablet by mouth daily.       Current Facility-Administered Medications  Medication Dose Route Frequency Provider Last Rate Last Dose  . 0.9 %  sodium chloride infusion  500 mL Intravenous Once Tressia Danas, MD        No Known Allergies  Family History  Problem Relation Age of Onset  . Transient ischemic attack  Father     Social History   Socioeconomic History  . Marital status: Married    Spouse name: Not on file  . Number of children: Not on file  . Years of education: Not on file  . Highest education level: Not on file  Occupational History  . Not on file  Social Needs  . Financial resource strain: Not on file  . Food insecurity:    Worry: Not on file    Inability: Not on file  . Transportation needs:    Medical: Not on file    Non-medical: Not on file  Tobacco Use  . Smoking status: Never Smoker  . Smokeless tobacco: Never Used  Substance and Sexual Activity  . Alcohol use: Yes    Comment: rare  . Drug use: No  . Sexual activity: Not on file  Lifestyle  . Physical activity:    Days per week: Not on file    Minutes per session: Not on file  . Stress: Not on file  Relationships  . Social connections:    Talks on phone: Not on file    Gets together: Not on file    Attends religious service: Not on file    Active member of club or organization: Not on file  Attends meetings of clubs or organizations: Not on file    Relationship status: Not on file  . Intimate partner violence:    Fear of current or ex partner: Not on file    Emotionally abused: Not on file    Physically abused: Not on file    Forced sexual activity: Not on file  Other Topics Concern  . Not on file  Social History Narrative   Regular exercise-no     Constitutional: Denies fever, malaise, fatigue, headache or abrupt weight changes.  Respiratory: Denies difficulty breathing, shortness of breath, cough or sputum production.   Cardiovascular: Denies chest pain, chest tightness, palpitations or swelling in the hands or feet.  Gastrointestinal: Denies abdominal pain, bloating, constipation, diarrhea or blood in the stool.  GU: Patient reports right flank pain.  Denies urgency, frequency, pain with urination, burning sensation, blood in urine, odor or discharge. Musculoskeletal: Patient reports right  upper back pain.  Denies decrease in range of motion, difficulty with gait,  or joint pain and swelling.  Skin: Denies redness, rashes, lesions or ulcercations.    No other specific complaints in a complete review of systems (except as listed in HPI above).     Objective:   Physical Exam    BP 132/84   Pulse 73   Temp 98 F (36.7 C) (Oral)   Wt 287 lb (130.2 kg)   SpO2 98%   BMI 41.77 kg/m  Wt Readings from Last 3 Encounters:  01/20/18 287 lb (130.2 kg)  01/02/18 283 lb (128.4 kg)  12/19/17 280 lb 12.8 oz (127.4 kg)    General: Appears his stated age, obese, in NAD. Skin: Warm, dry and intact. No rashes noted. Abdomen: Soft and nontender. Normal bowel sounds. No distention or masses noted. No CVA tenderness. Musculoskeletal: Normal flexion, extension and taken spine.  Nevertheless noted over the spine.  Normal internal and external rotation of the right shoulder.  No pain with palpation of the shoulder.  Pain with palpation along the edge of the scapula to the right of the thoracic spine.  Strength 5/5 BUE/BLE. Neurological: Alert and oriented.   BMET    Component Value Date/Time   NA 140 10/27/2017 0902   K 4.1 10/27/2017 0902   CL 104 10/27/2017 0902   CO2 30 10/27/2017 0902   GLUCOSE 116 (H) 10/27/2017 0902   BUN 16 10/27/2017 0902   CREATININE 1.40 10/27/2017 0902   CALCIUM 9.3 10/27/2017 0902   GFRNONAA 68 (L) 08/13/2011 0000   GFRAA 79 (L) 08/13/2011 0000    Lipid Panel     Component Value Date/Time   CHOL 188 10/27/2017 0902   TRIG 97.0 10/27/2017 0902   HDL 41.00 10/27/2017 0902   CHOLHDL 5 10/27/2017 0902   VLDL 19.4 10/27/2017 0902   LDLCALC 128 (H) 10/27/2017 0902    CBC    Component Value Date/Time   WBC 7.5 10/27/2017 0902   RBC 5.58 10/27/2017 0902   HGB 14.5 10/27/2017 0902   HCT 42.9 10/27/2017 0902   PLT 288.0 10/27/2017 0902   MCV 77.0 (L) 10/27/2017 0902   MCH 26.3 08/13/2011 0000   MCHC 33.7 10/27/2017 0902   RDW 14.2 10/27/2017  0902   LYMPHSABS 1.5 10/27/2017 0902   MONOABS 0.7 10/27/2017 0902   EOSABS 0.2 10/27/2017 0902   BASOSABS 0.0 10/27/2017 0902    Hgb A1C No results found for: HGBA1C        Assessment & Plan:   Right Flank Pain, Right  Upper Back Pain:  The report and x-ray not available for review. Urinalysis: Normal No indication to send urine culture at this time Pain is likely muscular as this is improving with anti-inflammatories and muscle relaxers Encourage stretching and use of heat as needed No indication for KUB or CT renal stone study at this time  Return precautions discussed Nicki Reaper, NP

## 2018-01-22 NOTE — Patient Instructions (Signed)
Muscle Strain A muscle strain (pulled muscle) happens when a muscle is stretched beyond normal length. It happens when a sudden, violent force stretches your muscle too far. Usually, a few of the fibers in your muscle are torn. Muscle strain is common in athletes. Recovery usually takes 1-2 weeks. Complete healing takes 5-6 weeks. Follow these instructions at home:  Follow the PRICE method of treatment to help your injury get better. Do this the first 2-3 days after the injury: ? Protect. Protect the muscle to keep it from getting injured again. ? Rest. Limit your activity and rest the injured body part. ? Ice. Put ice in a plastic bag. Place a towel between your skin and the bag. Then, apply the ice and leave it on from 15-20 minutes each hour. After the third day, switch to moist heat packs. ? Compression. Use a splint or elastic bandage on the injured area for comfort. Do not put it on too tightly. ? Elevate. Keep the injured body part above the level of your heart.  Only take medicine as told by your doctor.  Warm up before doing exercise to prevent future muscle strains. Contact a doctor if:  You have more pain or puffiness (swelling) in the injured area.  You feel numbness, tingling, or notice a loss of strength in the injured area. This information is not intended to replace advice given to you by your health care provider. Make sure you discuss any questions you have with your health care provider. Document Released: 12/12/2007 Document Revised: 08/10/2015 Document Reviewed: 10/01/2012 Elsevier Interactive Patient Education  2017 [employer]vier Inc.  

## 2018-07-14 ENCOUNTER — Other Ambulatory Visit: Payer: Self-pay | Admitting: Family Medicine

## 2018-10-10 ENCOUNTER — Other Ambulatory Visit: Payer: Self-pay | Admitting: Family Medicine

## 2019-01-06 ENCOUNTER — Telehealth: Payer: Self-pay

## 2019-01-06 NOTE — Telephone Encounter (Signed)
LVM w COVID screen, front door and back lab info  

## 2019-01-07 ENCOUNTER — Other Ambulatory Visit: Payer: Self-pay | Admitting: Family Medicine

## 2019-01-07 DIAGNOSIS — Z0000 Encounter for general adult medical examination without abnormal findings: Secondary | ICD-10-CM

## 2019-01-07 DIAGNOSIS — Z131 Encounter for screening for diabetes mellitus: Secondary | ICD-10-CM

## 2019-01-08 ENCOUNTER — Other Ambulatory Visit (INDEPENDENT_AMBULATORY_CARE_PROVIDER_SITE_OTHER): Payer: Federal, State, Local not specified - PPO

## 2019-01-08 DIAGNOSIS — Z131 Encounter for screening for diabetes mellitus: Secondary | ICD-10-CM | POA: Diagnosis not present

## 2019-01-08 DIAGNOSIS — Z0000 Encounter for general adult medical examination without abnormal findings: Secondary | ICD-10-CM | POA: Diagnosis not present

## 2019-01-08 LAB — CBC WITH DIFFERENTIAL/PLATELET
Basophils Absolute: 0 10*3/uL (ref 0.0–0.1)
Basophils Relative: 0.4 % (ref 0.0–3.0)
Eosinophils Absolute: 0.2 10*3/uL (ref 0.0–0.7)
Eosinophils Relative: 2.1 % (ref 0.0–5.0)
HCT: 40.2 % (ref 39.0–52.0)
Hemoglobin: 13.4 g/dL (ref 13.0–17.0)
Lymphocytes Relative: 21.9 % (ref 12.0–46.0)
Lymphs Abs: 1.6 10*3/uL (ref 0.7–4.0)
MCHC: 33.3 g/dL (ref 30.0–36.0)
MCV: 76.8 fl — ABNORMAL LOW (ref 78.0–100.0)
Monocytes Absolute: 0.6 10*3/uL (ref 0.1–1.0)
Monocytes Relative: 7.6 % (ref 3.0–12.0)
Neutro Abs: 5 10*3/uL (ref 1.4–7.7)
Neutrophils Relative %: 68 % (ref 43.0–77.0)
Platelets: 240 10*3/uL (ref 150.0–400.0)
RBC: 5.24 Mil/uL (ref 4.22–5.81)
RDW: 15 % (ref 11.5–15.5)
WBC: 7.3 10*3/uL (ref 4.0–10.5)

## 2019-01-08 LAB — HEPATIC FUNCTION PANEL
ALT: 19 U/L (ref 0–53)
AST: 29 U/L (ref 0–37)
Albumin: 4.1 g/dL (ref 3.5–5.2)
Alkaline Phosphatase: 65 U/L (ref 39–117)
Bilirubin, Direct: 0.2 mg/dL (ref 0.0–0.3)
Total Bilirubin: 0.8 mg/dL (ref 0.2–1.2)
Total Protein: 6.6 g/dL (ref 6.0–8.3)

## 2019-01-08 LAB — BASIC METABOLIC PANEL
BUN: 18 mg/dL (ref 6–23)
CO2: 27 mEq/L (ref 19–32)
Calcium: 9.2 mg/dL (ref 8.4–10.5)
Chloride: 101 mEq/L (ref 96–112)
Creatinine, Ser: 1.28 mg/dL (ref 0.40–1.50)
GFR: 71.24 mL/min (ref >60.00)
Glucose, Bld: 83 mg/dL (ref 70–99)
Potassium: 4 mEq/L (ref 3.5–5.1)
Sodium: 139 mEq/L (ref 135–145)

## 2019-01-08 LAB — LIPID PANEL
Cholesterol: 193 mg/dL (ref 0–200)
HDL: 41.8 mg/dL (ref >39.00)
LDL Cholesterol: 135 mg/dL — ABNORMAL HIGH (ref 0–99)
NonHDL: 151.25
Total CHOL/HDL Ratio: 5
Triglycerides: 79 mg/dL (ref 0.0–149.0)
VLDL: 15.8 mg/dL (ref 0.0–40.0)

## 2019-01-08 LAB — HEMOGLOBIN A1C: Hgb A1c MFr Bld: 5.9 % (ref 4.6–6.5)

## 2019-01-11 LAB — PSA, TOTAL WITH REFLEX TO PSA, FREE: PSA, Total: 0.2 ng/mL (ref <=4.0)

## 2019-01-13 ENCOUNTER — Other Ambulatory Visit: Payer: Self-pay

## 2019-01-13 ENCOUNTER — Encounter: Payer: Self-pay | Admitting: Family Medicine

## 2019-01-13 ENCOUNTER — Ambulatory Visit (INDEPENDENT_AMBULATORY_CARE_PROVIDER_SITE_OTHER): Payer: Federal, State, Local not specified - PPO | Admitting: Family Medicine

## 2019-01-13 VITALS — BP 150/82 | HR 71 | Temp 98.1°F | Ht 69.25 in | Wt 265.5 lb

## 2019-01-13 DIAGNOSIS — Z23 Encounter for immunization: Secondary | ICD-10-CM

## 2019-01-13 DIAGNOSIS — Z0000 Encounter for general adult medical examination without abnormal findings: Secondary | ICD-10-CM | POA: Diagnosis not present

## 2019-01-13 NOTE — Progress Notes (Signed)
Jessen Siegman T. Samin Milke, MD Primary Care and [place] at [facility] [place], [address] Phone: [phone]  FAX: [place] - 53 y.o. male  MRN [id number]  Date of Birth: 21-Dec-1965  Visit Date: 01/13/2019  PCP: Owens Loffler, MD  Referred by: Owens Loffler, MD  Chief Complaint  Patient presents with  . Annual Exam   Patient Care Team: Owens Loffler, MD as PCP - General Subjective:   Nikolai Wilczak Yebra is a 53 y.o. pleasant patient who presents with the following:  Preventative Health Maintenance Visit:  Health Maintenance Summary Reviewed and updated, unless pt declines services.  Tobacco History Reviewed. Alcohol: No concerns, no excessive use Exercise Habits: Some activity, rec at least 30 mins 5 times a week STD concerns: no risk or activity to increase risk Drug Use: None Encouraged self-testicular check  BP Readings from Last 3 Encounters:  01/13/19 (!) 150/82  01/20/18 132/84  01/02/18 119/77    Wt Readings from Last 3 Encounters:  01/13/19 265 lb 8 oz (120.4 kg)  01/20/18 287 lb (130.2 kg)  01/02/18 283 lb (128.4 kg)     He has already lost 22 pounds, and he should be congratulated.  Health Maintenance  Topic Date Due  . HIV Screening  06/04/1981  . INFLUENZA VACCINE  10/17/2018  . TETANUS/TDAP  05/09/2025  . COLONOSCOPY  01/03/2028   Immunization History  Administered Date(s) Administered  . Influenza Inj Mdck Quad Pf 12/25/2017  . Influenza Split 12/10/2010, 01/06/2012  . Influenza-Unspecified 12/17/2014, 02/14/2017  . Td 03/18/2005  . Tdap 05/10/2015   Patient Active Problem List   Diagnosis Date Noted  . Solitary kidney, congenital 05/10/2015  . Right carpal tunnel syndrome 01/01/2013  . Essential hypertension 03/07/2009   Past Medical History:  Diagnosis Date  . Allergy    seasonal  . Chronic kidney disease    one kidney  . Hypertension    . Solitary kidney, congenital 05/10/2015   Past Surgical History:  Procedure Laterality Date  . CHEST TUBE INSERTION     collasped lung in high school  . NEPHROSTOMY  1975   left  . TYMPANOSTOMY TUBE PLACEMENT  2010   right  . VASECTOMY     Social History   Socioeconomic History  . Marital status: Married    Spouse name: Not on file  . Number of children: Not on file  . Years of education: Not on file  . Highest education level: Not on file  Occupational History  . Not on file  Social Needs  . Financial resource strain: Not on file  . Food insecurity    Worry: Not on file    Inability: Not on file  . Transportation needs    Medical: Not on file    Non-medical: Not on file  Tobacco Use  . Smoking status: Never Smoker  . Smokeless tobacco: Never Used  Substance and Sexual Activity  . Alcohol use: Yes    Comment: rare  . Drug use: No  . Sexual activity: Not on file  Lifestyle  . Physical activity    Days per week: Not on file    Minutes per session: Not on file  . Stress: Not on file  Relationships  . Social Herbalist on phone: Not on file    Gets together: Not on file    Attends religious service: Not on file    Active member  of club or organization: Not on file    Attends meetings of clubs or organizations: Not on file    Relationship status: Not on file  . Intimate partner violence    Fear of current or ex partner: Not on file    Emotionally abused: Not on file    Physically abused: Not on file    Forced sexual activity: Not on file  Other Topics Concern  . Not on file  Social History Narrative   Regular exercise-no   Family History  Problem Relation Age of Onset  . Transient ischemic attack Father    No Known Allergies  Medication list has been reviewed and updated.   General: Denies fever, chills, sweats. No significant weight loss. Eyes: Denies blurring,significant itching ENT: Denies earache, sore throat, and hoarseness.  Cardiovascular: Denies chest pains, palpitations, dyspnea on exertion Respiratory: Denies cough, dyspnea at rest,wheeezing Breast: no concerns about lumps GI: Denies nausea, vomiting, diarrhea, constipation, change in bowel habits, abdominal pain, melena, hematochezia GU: Denies penile discharge, ED, urinary flow / outflow problems. No STD concerns. Musculoskeletal: Denies back pain, joint pain Derm: Denies rash, itching Neuro: Denies  paresthesias, frequent falls, frequent headaches Psych: Denies depression, anxiety Endocrine: Denies cold intolerance, heat intolerance, polydipsia Heme: Denies enlarged lymph nodes Allergy: No hayfever  Objective:   BP (!) 150/82   Pulse 71   Temp 98.1 F (36.7 C) (Temporal)   Ht 5' 9.25" (1.759 m)   Wt 265 lb 8 oz (120.4 kg)   SpO2 98%   BMI 38.92 kg/m  Ideal Body Weight: Weight in (lb) to have BMI = 25: 170.2  Ideal Body Weight: Weight in (lb) to have BMI = 25: 170.2 No exam data present Depression screen [facility] 2/9 01/13/2019 11/03/2017  Decreased Interest 0 0  Down, Depressed, Hopeless 0 0  PHQ - 2 Score 0 0     GEN: well developed, well nourished, no acute distress Eyes: conjunctiva and lids normal, PERRLA, EOMI ENT: TM clear, nares clear, oral exam WNL Neck: supple, no lymphadenopathy, no thyromegaly, no JVD Pulm: clear to auscultation and percussion, respiratory effort normal CV: regular rate and rhythm, S1-S2, no murmur, rub or gallop, no bruits, peripheral pulses normal and symmetric, no cyanosis, clubbing, edema or varicosities GI: soft, non-tender; no hepatosplenomegaly, masses; active bowel sounds all quadrants GU: no hernia, testicular mass, penile discharge Lymph: no cervical, axillary or inguinal adenopathy MSK: gait normal, muscle tone and strength WNL, no joint swelling, effusions, discoloration, crepitus  SKIN: clear, good turgor, color WNL, no rashes, lesions, or ulcerations Neuro: normal mental status, normal strength,  sensation, and motion Psych: alert; oriented to person, place and time, normally interactive and not anxious or depressed in appearance. All labs reviewed with patient. Results for orders placed or performed in visit on 01/08/19  Basic metabolic panel  Result Value Ref Range   Sodium 139 135 - 145 mEq/L   Potassium 4.0 3.5 - 5.1 mEq/L   Chloride 101 96 - 112 mEq/L   CO2 27 19 - 32 mEq/L   Glucose, Bld 83 70 - 99 mg/dL   BUN 18 6 - 23 mg/dL   Creatinine, Ser 4.09 0.40 - 1.50 mg/dL   Calcium 9.2 8.4 - 81.1 mg/dL   GFR 91.47 >82.95 mL/min  PSA, Total with Reflex to PSA, Free  Result Value Ref Range   PSA, Total 0.2 < OR = 4.0 ng/mL  Hemoglobin A1c  Result Value Ref Range   Hgb A1c MFr Bld 5.9  4.6 - 6.5 %  CBC with Differential/Platelet  Result Value Ref Range   WBC 7.3 4.0 - 10.5 K/uL   RBC 5.24 4.22 - 5.81 Mil/uL   Hemoglobin 13.4 13.0 - 17.0 g/dL   HCT 16.140.2 09.639.0 - 04.552.0 %   MCV 76.8 (L) 78.0 - 100.0 fl   MCHC 33.3 30.0 - 36.0 g/dL   RDW 40.915.0 81.111.5 - 91.415.5 %   Platelets 240.0 150.0 - 400.0 K/uL   Neutrophils Relative % 68.0 43.0 - 77.0 %   Lymphocytes Relative 21.9 12.0 - 46.0 %   Monocytes Relative 7.6 3.0 - 12.0 %   Eosinophils Relative 2.1 0.0 - 5.0 %   Basophils Relative 0.4 0.0 - 3.0 %   Neutro Abs 5.0 1.4 - 7.7 K/uL   Lymphs Abs 1.6 0.7 - 4.0 K/uL   Monocytes Absolute 0.6 0.1 - 1.0 K/uL   Eosinophils Absolute 0.2 0.0 - 0.7 K/uL   Basophils Absolute 0.0 0.0 - 0.1 K/uL  Hepatic function panel  Result Value Ref Range   Total Bilirubin 0.8 0.2 - 1.2 mg/dL   Bilirubin, Direct 0.2 0.0 - 0.3 mg/dL   Alkaline Phosphatase 65 39 - 117 U/L   AST 29 0 - 37 U/L   ALT 19 0 - 53 U/L   Total Protein 6.6 6.0 - 8.3 g/dL   Albumin 4.1 3.5 - 5.2 g/dL  Lipid panel  Result Value Ref Range   Cholesterol 193 0 - 200 mg/dL   Triglycerides 78.279.0 0.0 - 149.0 mg/dL   HDL 95.6241.80 >13.08>39.00 mg/dL   VLDL 65.715.8 0.0 - 84.640.0 mg/dL   LDL Cholesterol 962135 (H) 0 - 99 mg/dL   Total CHOL/HDL Ratio 5     NonHDL 151.25     Assessment and Plan:     ICD-10-CM   1. Healthcare maintenance  Z00.00    His blood pressure is modestly elevated today, but previously had been normal.  His goal is to lose 40 pounds in the next year.  Health Maintenance Exam: The patient's preventative maintenance and recommended screening tests for an annual wellness exam were reviewed in full today. Brought up to date unless services declined.  Counselled on the importance of diet, exercise, and its role in overall health and mortality. The patient's FH and SH was reviewed, including their home life, tobacco status, and drug and alcohol status.  Follow-up in 1 year for physical exam or additional follow-up below.  Follow-up: No follow-ups on file. Or follow-up in 1 year if not noted.  No orders of the defined types were placed in this encounter.  Medications Discontinued During This Encounter  Medication Reason  . cyclobenzaprine (FLEXERIL) 5 MG tablet Completed Course  . diclofenac (VOLTAREN) 75 MG EC tablet Completed Course  . fluticasone (FLONASE) 50 MCG/ACT nasal spray Completed Course   No orders of the defined types were placed in this encounter.   Signed,  Elpidio GaleaSpencer T. Veron Senner, MD   Allergies as of 01/13/2019   No Known Allergies     Medication List       Accurate as of January 13, 2019  2:40 PM. If you have any questions, ask your nurse or doctor.        STOP taking these medications   cyclobenzaprine 5 MG tablet Commonly known as: FLEXERIL Stopped by: Hannah BeatSpencer Amarra Sawyer, MD   diclofenac 75 MG EC tablet Commonly known as: VOLTAREN Stopped by: Hannah BeatSpencer Kaysie Michelini, MD   fluticasone 50 MCG/ACT nasal spray Commonly known as: FLONASE Stopped  by: Hannah Beat, MD     TAKE these medications   aspirin 81 MG EC tablet Take 81 mg by mouth every other day. Swallow whole.   losartan 100 MG tablet Commonly known as: COZAAR TAKE 1 TABLET(100 MG) BY MOUTH DAILY   multivitamin tablet Take  1 tablet by mouth daily.

## 2019-01-18 ENCOUNTER — Other Ambulatory Visit: Payer: Self-pay | Admitting: Family Medicine

## 2019-03-22 ENCOUNTER — Encounter: Payer: Self-pay | Admitting: Family Medicine

## 2019-03-22 ENCOUNTER — Ambulatory Visit (INDEPENDENT_AMBULATORY_CARE_PROVIDER_SITE_OTHER): Payer: Federal, State, Local not specified - PPO | Admitting: Family Medicine

## 2019-03-22 ENCOUNTER — Other Ambulatory Visit: Payer: Self-pay

## 2019-03-22 VITALS — BP 130/80 | HR 74 | Temp 97.9°F | Ht 69.25 in | Wt 256.5 lb

## 2019-03-22 DIAGNOSIS — Z23 Encounter for immunization: Secondary | ICD-10-CM

## 2019-03-22 DIAGNOSIS — R040 Epistaxis: Secondary | ICD-10-CM

## 2019-03-22 DIAGNOSIS — I10 Essential (primary) hypertension: Secondary | ICD-10-CM | POA: Diagnosis not present

## 2019-03-22 NOTE — Progress Notes (Signed)
Cyd Hostler T. Clemens Lachman, MD Primary Care and [place] at [facility] [place], [address] Phone: [phone]  FAX: [place] - 54 y.o. male  MRN [id number]  Date of Birth: [birth year]  Visit Date: 03/22/2019  PCP: Owens Loffler, MD  Referred by: Owens Loffler, MD  Chief Complaint  Patient presents with  . Epistaxis    This morning-wants nose looked at    This visit occurred during the SARS-CoV-2 public health emergency.  Safety protocols were in place, including screening questions prior to the visit, additional usage of staff PPE, and extensive cleaning of exam room while observing appropriate contact time as indicated for disinfecting solutions.   Subjective:   Andrew Stout is a 54 y.o. very pleasant male patient who presents with the following:  Doing well and continues to lose weight.  He has had some recent nosebleeds  HTN: Tolerating all medications without side effects Stable and at goal No CP, no sob. No HA.  BP Readings from Last 3 Encounters:  03/22/19 130/80  01/13/19 (!) 150/82  01/20/18 660/63    Basic Metabolic Panel:    Component Value Date/Time   NA 139 01/08/2019 0841   K 4.0 01/08/2019 0841   CL 101 01/08/2019 0841   CO2 27 01/08/2019 0841   BUN 18 01/08/2019 0841   CREATININE 1.28 01/08/2019 0841   GLUCOSE 83 01/08/2019 0841   CALCIUM 9.2 01/08/2019 0841    Wt Readings from Last 3 Encounters:  03/22/19 256 lb 8 oz (116.3 kg)  01/13/19 265 lb 8 oz (120.4 kg)  01/20/18 287 lb (130.2 kg)     Past Medical History, Surgical History, Social History, Family History, Problem List, Medications, and Allergies have been reviewed and updated if relevant.  Patient Active Problem List   Diagnosis Date Noted  . Solitary kidney, congenital 05/10/2015  . Right carpal tunnel syndrome 01/01/2013  . Essential hypertension 03/07/2009    Past Medical History:    Diagnosis Date  . Allergy    seasonal  . Chronic kidney disease    one kidney  . Hypertension   . Solitary kidney, congenital 05/10/2015    Past Surgical History:  Procedure Laterality Date  . CHEST TUBE INSERTION     collasped lung in high school  . NEPHROSTOMY  1975   left  . TYMPANOSTOMY TUBE PLACEMENT  2010   right  . VASECTOMY      Social History   Socioeconomic History  . Marital status: Married    Spouse name: Not on file  . Number of children: Not on file  . Years of education: Not on file  . Highest education level: Not on file  Occupational History  . Not on file  Tobacco Use  . Smoking status: Never Smoker  . Smokeless tobacco: Never Used  Substance and Sexual Activity  . Alcohol use: Yes    Comment: rare  . Drug use: No  . Sexual activity: Not on file  Other Topics Concern  . Not on file  Social History Narrative   Regular exercise-no   Social Determinants of Health   Financial Resource Strain:   . Difficulty of Paying Living Expenses: Not on file  Food Insecurity:   . Worried About Charity fundraiser in the Last Year: Not on file  . Ran Out of Food in the Last Year: Not on file  Transportation Needs:   . Lack of  Transportation (Medical): Not on file  . Lack of Transportation (Non-Medical): Not on file  Physical Activity:   . Days of Exercise per Week: Not on file  . Minutes of Exercise per Session: Not on file  Stress:   . Feeling of Stress : Not on file  Social Connections:   . Frequency of Communication with Friends and Family: Not on file  . Frequency of Social Gatherings with Friends and Family: Not on file  . Attends Religious Services: Not on file  . Active Member of Clubs or Organizations: Not on file  . Attends Banker Meetings: Not on file  . Marital Status: Not on file  Intimate Partner Violence:   . Fear of Current or Ex-Partner: Not on file  . Emotionally Abused: Not on file  . Physically Abused: Not on file   . Sexually Abused: Not on file    Family History  Problem Relation Age of Onset  . Transient ischemic attack Father     No Known Allergies  Medication list reviewed and updated in full in [facility].   GEN: No acute illnesses, no fevers, chills. GI: No n/v/d, eating normally Pulm: No SOB Interactive and getting along well at home.  Otherwise, ROS is as per the HPI.  Objective:   BP 130/80   Pulse 74   Temp 97.9 F (36.6 C) (Temporal)   Ht 5' 9.25" (1.759 m)   Wt 256 lb 8 oz (116.3 kg)   SpO2 98%   BMI 37.61 kg/m   GEN: WDWN, NAD, Non-toxic, A & O x 3 HEENT: Atraumatic, Normocephalic. Neck supple. No masses, No LAD. R nostril with some dry blood and B injected turbinates Ears and Nose: No external deformity. CV: RRR, No M/G/R. No JVD. No thrill. No extra heart sounds. PULM: CTA B, no wheezes, crackles, rhonchi. No retractions. No resp. distress. No accessory muscle use. EXTR: No c/c/e NEURO Normal gait.  PSYCH: Normally interactive. Conversant. Not depressed or anxious appearing.  Calm demeanor.   Laboratory and Imaging Data:  Assessment and Plan:     ICD-10-CM   1. Essential hypertension  I10   2. Need for shingles vaccine  Z23 Varicella-zoster vaccine IM (Shingrix)  3. Epistaxis not due to trauma  R04.0    Doing well and losing weight, bp at goal  No concern about nose bleed. Pressure if recurs + some vaseline  Follow-up: No follow-ups on file.  No orders of the defined types were placed in this encounter.  Orders Placed This Encounter  Procedures  . Varicella-zoster vaccine IM (Shingrix)    Signed,  Mirely Pangle T. Nefi Musich, MD   Outpatient Encounter Medications as of 03/22/2019  Medication Sig  . aspirin 81 MG EC tablet Take 81 mg by mouth every other day. Swallow whole.  . losartan (COZAAR) 100 MG tablet TAKE 1 TABLET(100 MG) BY MOUTH DAILY  . Multiple Vitamin (MULTIVITAMIN) tablet Take 1 tablet by mouth daily.     Facility-Administered  Encounter Medications as of 03/22/2019  Medication  . 0.9 %  sodium chloride infusion

## 2019-07-16 ENCOUNTER — Other Ambulatory Visit: Payer: Self-pay | Admitting: Family Medicine

## 2019-12-08 ENCOUNTER — Other Ambulatory Visit: Payer: Self-pay

## 2019-12-08 ENCOUNTER — Ambulatory Visit: Payer: Federal, State, Local not specified - PPO | Admitting: Family Medicine

## 2019-12-08 ENCOUNTER — Encounter: Payer: Self-pay | Admitting: Family Medicine

## 2019-12-08 VITALS — BP 120/80 | HR 74 | Temp 98.3°F | Ht 69.25 in | Wt 256.5 lb

## 2019-12-08 DIAGNOSIS — M67431 Ganglion, right wrist: Secondary | ICD-10-CM

## 2019-12-08 MED ORDER — METHYLPREDNISOLONE ACETATE 40 MG/ML IJ SUSP
20.0000 mg | Freq: Once | INTRAMUSCULAR | Status: AC
  Administered 2019-12-08: 20 mg via INTRA_ARTICULAR

## 2019-12-08 NOTE — Progress Notes (Signed)
    Cassundra Mckeever T. Chevette Fee, MD, CAQ Sports Medicine  Primary Care and Sports Medicine [facility] at [facility] [address] [place], [address]  Phone: [phone]  FAX: [phone]  KEENA HEESCH - 54 y.o. male  MRN [id number]  Date of Birth: [birth year]  Date: 12/08/2019  PCP: Hannah Beat, MD  Referral: Hannah Beat, MD  Chief Complaint  Patient presents with  . Ganglion Cyst    Right Wrist    This visit occurred during the SARS-CoV-2 public health emergency.  Safety protocols were in place, including screening questions prior to the visit, additional usage of staff PPE, and extensive cleaning of exam room while observing appropriate contact time as indicated for disinfecting solutions.   Subjective:   Andrew Stout is a 54 y.o. very pleasant male patient with Body mass index is 37.61 kg/m. who presents with the following:  R wrist:   He has a known dorsal ganglion cyst.  Did have this drained and injected for 5 years ago and this did help shrink it somewhat but he has had some increase in size more recently and wanted me to evaluate.  Asp ganglion R wrist pic  Asp 3 cc jelly  Review of Systems is noted in the HPI, as appropriate   Objective:   BP 120/80   Pulse 74   Temp 98.3 F (36.8 C) (Temporal)   Ht 5' 9.25" (1.759 m)   Wt 256 lb 8 oz (116.3 kg)   SpO2 97%   BMI 37.61 kg/m    GEN: No acute distress; alert,appropriate. PULM: Breathing comfortably in no respiratory distress PSYCH: Normally interactive.        Radiology: No results found.  Assessment and Plan:     ICD-10-CM   1. Ganglion cyst of wrist, right  M67.431 methylPREDNISolone acetate (DEPO-MEDROL) injection 20 mg   Reviewed ganglion.  Likely communicates with the true wrist joint.  No neurological impairment at this time.  Aspiration/Injection Procedure Note Yardley Lekas Quiett 1965/11/13 Date of procedure: 12/08/2019  Procedure:  Aspiration / Injection of Ganglion Cyst, R Indications: Pain  Procedure Details The patient was verbally consented. Risks, benefits, and alternatives were discussed including lightening of the skin and potential infection. The patient was prepped with Chloraprep.  3 cc of gelatinous material was aspirated.  A mixture of 1/2 cc of lidocaine 1% and Depo-Medrol 20 mg was injected into the ganglion cyst directly. No difficulty. No complications. Medication: Depo-Medrol 20 mg   Follow-up: No follow-ups on file.  Meds ordered this encounter  Medications  . methylPREDNISolone acetate (DEPO-MEDROL) injection 20 mg   There are no discontinued medications. No orders of the defined types were placed in this encounter.   Signed,  Elpidio Galea. Keonia Pasko, MD   Outpatient Encounter Medications as of 12/08/2019  Medication Sig  . aspirin 81 MG EC tablet Take 81 mg by mouth every other day. Swallow whole.  . losartan (COZAAR) 100 MG tablet TAKE 1 TABLET(100 MG) BY MOUTH DAILY  . Multiple Vitamin (MULTIVITAMIN) tablet Take 1 tablet by mouth daily.     Facility-Administered Encounter Medications as of 12/08/2019  Medication  . 0.9 %  sodium chloride infusion  . [COMPLETED] methylPREDNISolone acetate (DEPO-MEDROL) injection 20 mg

## 2020-01-06 ENCOUNTER — Other Ambulatory Visit: Payer: Self-pay | Admitting: Family Medicine

## 2020-01-06 NOTE — Telephone Encounter (Signed)
Please schedule CPE with fasting labs for Dr. Copland. 

## 2020-03-01 DIAGNOSIS — H35033 Hypertensive retinopathy, bilateral: Secondary | ICD-10-CM | POA: Diagnosis not present

## 2020-05-23 ENCOUNTER — Other Ambulatory Visit: Payer: Self-pay | Admitting: Family Medicine

## 2020-05-23 NOTE — Telephone Encounter (Signed)
Please schedule CPE with fasting labs prior with Dr. Copland.  

## 2020-05-23 NOTE — Telephone Encounter (Signed)
Left voice message to call the office  

## 2020-06-07 ENCOUNTER — Other Ambulatory Visit (INDEPENDENT_AMBULATORY_CARE_PROVIDER_SITE_OTHER): Payer: Federal, State, Local not specified - PPO

## 2020-06-07 ENCOUNTER — Other Ambulatory Visit: Payer: Self-pay

## 2020-06-07 DIAGNOSIS — Z131 Encounter for screening for diabetes mellitus: Secondary | ICD-10-CM | POA: Diagnosis not present

## 2020-06-07 DIAGNOSIS — Q600 Renal agenesis, unilateral: Secondary | ICD-10-CM

## 2020-06-07 DIAGNOSIS — Z125 Encounter for screening for malignant neoplasm of prostate: Secondary | ICD-10-CM

## 2020-06-07 DIAGNOSIS — Z79899 Other long term (current) drug therapy: Secondary | ICD-10-CM

## 2020-06-07 DIAGNOSIS — Z13220 Encounter for screening for lipoid disorders: Secondary | ICD-10-CM | POA: Diagnosis not present

## 2020-06-07 LAB — CBC WITH DIFFERENTIAL/PLATELET
Basophils Absolute: 0 10*3/uL (ref 0.0–0.1)
Basophils Relative: 0.5 % (ref 0.0–3.0)
Eosinophils Absolute: 0.1 10*3/uL (ref 0.0–0.7)
Eosinophils Relative: 2.1 % (ref 0.0–5.0)
HCT: 45.5 % (ref 39.0–52.0)
Hemoglobin: 15.3 g/dL (ref 13.0–17.0)
Lymphocytes Relative: 21 % (ref 12.0–46.0)
Lymphs Abs: 1.5 10*3/uL (ref 0.7–4.0)
MCHC: 33.6 g/dL (ref 30.0–36.0)
MCV: 77.8 fl — ABNORMAL LOW (ref 78.0–100.0)
Monocytes Absolute: 0.5 10*3/uL (ref 0.1–1.0)
Monocytes Relative: 7.7 % (ref 3.0–12.0)
Neutro Abs: 4.8 10*3/uL (ref 1.4–7.7)
Neutrophils Relative %: 68.7 % (ref 43.0–77.0)
Platelets: 243 10*3/uL (ref 150.0–400.0)
RBC: 5.85 Mil/uL — ABNORMAL HIGH (ref 4.22–5.81)
RDW: 14.5 % (ref 11.5–15.5)
WBC: 6.9 10*3/uL (ref 4.0–10.5)

## 2020-06-07 LAB — LIPID PANEL
Cholesterol: 195 mg/dL (ref 0–200)
HDL: 52 mg/dL (ref >39.00)
LDL Cholesterol: 127 mg/dL — ABNORMAL HIGH (ref 0–99)
NonHDL: 143.08
Total CHOL/HDL Ratio: 4
Triglycerides: 80 mg/dL (ref 0.0–149.0)
VLDL: 16 mg/dL (ref 0.0–40.0)

## 2020-06-07 LAB — URINALYSIS, ROUTINE W REFLEX MICROSCOPIC
Bilirubin Urine: NEGATIVE
Hgb urine dipstick: NEGATIVE
Ketones, ur: NEGATIVE
Leukocytes,Ua: NEGATIVE
Nitrite: NEGATIVE
RBC / HPF: NONE SEEN (ref 0–?)
Specific Gravity, Urine: 1.02 (ref 1.000–1.030)
Total Protein, Urine: NEGATIVE
Urine Glucose: NEGATIVE
Urobilinogen, UA: 1 (ref 0.0–1.0)
pH: 6.5 (ref 5.0–8.0)

## 2020-06-07 LAB — BASIC METABOLIC PANEL
BUN: 18 mg/dL (ref 6–23)
CO2: 30 mEq/L (ref 19–32)
Calcium: 9.3 mg/dL (ref 8.4–10.5)
Chloride: 104 mEq/L (ref 96–112)
Creatinine, Ser: 1.28 mg/dL (ref 0.40–1.50)
GFR: 63.67 mL/min (ref >60.00)
Glucose, Bld: 110 mg/dL — ABNORMAL HIGH (ref 70–99)
Potassium: 4.2 mEq/L (ref 3.5–5.1)
Sodium: 141 mEq/L (ref 135–145)

## 2020-06-07 LAB — HEPATIC FUNCTION PANEL
ALT: 21 U/L (ref 0–53)
AST: 24 U/L (ref 0–37)
Albumin: 4.2 g/dL (ref 3.5–5.2)
Alkaline Phosphatase: 64 U/L (ref 39–117)
Bilirubin, Direct: 0.2 mg/dL (ref 0.0–0.3)
Total Bilirubin: 0.7 mg/dL (ref 0.2–1.2)
Total Protein: 6.6 g/dL (ref 6.0–8.3)

## 2020-06-07 LAB — MICROALBUMIN / CREATININE URINE RATIO
Creatinine,U: 123 mg/dL
Microalb Creat Ratio: 1.1 mg/g (ref 0.0–30.0)
Microalb, Ur: 1.3 mg/dL (ref 0.0–1.9)

## 2020-06-07 LAB — HEMOGLOBIN A1C: Hgb A1c MFr Bld: 5.9 % (ref 4.6–6.5)

## 2020-06-07 NOTE — Addendum Note (Signed)
Addended by: Alvina Chou on: 06/07/2020 12:39 PM   Modules accepted: Orders

## 2020-06-08 LAB — PSA, TOTAL WITH REFLEX TO PSA, FREE: PSA, Total: 0.2 ng/mL (ref <=4.0)

## 2020-06-14 ENCOUNTER — Other Ambulatory Visit: Payer: Self-pay

## 2020-06-14 ENCOUNTER — Encounter: Payer: Self-pay | Admitting: Family Medicine

## 2020-06-14 ENCOUNTER — Ambulatory Visit (INDEPENDENT_AMBULATORY_CARE_PROVIDER_SITE_OTHER): Payer: Federal, State, Local not specified - PPO | Admitting: Family Medicine

## 2020-06-14 VITALS — BP 130/76 | HR 70 | Temp 98.1°F | Ht 69.5 in | Wt 263.0 lb

## 2020-06-14 DIAGNOSIS — Z0000 Encounter for general adult medical examination without abnormal findings: Secondary | ICD-10-CM

## 2020-06-14 MED ORDER — SCOPOLAMINE 1 MG/3DAYS TD PT72: 1.0000 | MEDICATED_PATCH | TRANSDERMAL | 0 refills | Status: DC

## 2020-06-14 NOTE — Progress Notes (Signed)
Andrew Stout T. Andrew Rady, MD, CAQ Sports Medicine  Primary Care and Sports Medicine [facility] at [facility] [address] [place], [address]  Phone: [phone]  FAX: [phone]  Andrew Stout - 55 y.o. male  MRN [id number]  Date of Birth: [birth year]  Date: 06/14/2020  PCP: Hannah Beat, MD  Referral: Hannah Beat, MD  Chief Complaint  Patient presents with  . Annual Exam    This visit occurred during the SARS-CoV-2 public health emergency.  Safety protocols were in place, including screening questions prior to the visit, additional usage of staff PPE, and extensive cleaning of exam room while observing appropriate contact time as indicated for disinfecting solutions.   Patient Care Team: Hannah Beat, MD as PCP - General Subjective:   Andrew Stout is a 55 y.o. pleasant patient who presents with the following:  Preventative Health Maintenance Visit:  Health Maintenance Summary Reviewed and updated, unless pt declines services.  Tobacco History Reviewed. Alcohol: No concerns, no excessive use. Rare. Exercise Habits: Some activity, rec at least 30 mins 5 times a week STD concerns: no risk or activity to increase risk Drug Use: None  Bump on back.  Working out -he has been working out about a couple days per week and walking more.    Wt Readings from Last 3 Encounters:  06/14/20 263 lb (119.3 kg)  12/08/19 256 lb 8 oz (116.3 kg)  03/22/19 256 lb 8 oz (116.3 kg)     Health Maintenance  Topic Date Due  . Hepatitis C Screening  Never done  . HIV Screening  Never done  . INFLUENZA VACCINE  07/31/2020 (Originally 10/17/2019)  . TETANUS/TDAP  05/09/2025  . COLONOSCOPY (Pts 45-16yrs Insurance coverage will need to be confirmed)  01/03/2028  . COVID-19 Vaccine  Completed  . HPV VACCINES  Aged Out   Immunization History  Administered Date(s) Administered  . Influenza Inj Mdck Quad Pf 12/25/2017  . Influenza Split  12/10/2010, 01/06/2012  . Influenza,inj,Quad PF,6+ Mos 01/13/2019  . Influenza-Unspecified 12/17/2014, 02/14/2017  . Moderna Sars-Covid-2 Vaccination 05/26/2019, 06/23/2019, 02/21/2020  . Td 03/18/2005  . Tdap 05/10/2015  . Zoster Recombinat (Shingrix) 01/13/2019, 03/22/2019   Patient Active Problem List   Diagnosis Date Noted  . Solitary kidney, congenital 05/10/2015  . Right carpal tunnel syndrome 01/01/2013  . Essential hypertension 03/07/2009    Past Medical History:  Diagnosis Date  . Allergy    seasonal  . Chronic kidney disease    one kidney  . Hypertension   . Solitary kidney, congenital 05/10/2015    Past Surgical History:  Procedure Laterality Date  . CHEST TUBE INSERTION     collasped lung in high school  . NEPHROSTOMY  1975   left  . TYMPANOSTOMY TUBE PLACEMENT  2010   right  . VASECTOMY      Family History  Problem Relation Age of Onset  . Transient ischemic attack Father     Past Medical History, Surgical History, Social History, Family History, Problem List, Medications, and Allergies have been reviewed and updated if relevant.  Review of Systems: Pertinent positives are listed above.  Otherwise, a full 14 point review of systems has been done in full and it is negative except where it is noted positive.  Objective:   BP 130/76   Pulse 70   Temp 98.1 F (36.7 C) (Temporal)   Ht 5' 9.5" (1.765 m)   Wt 263 lb (119.3 kg)   SpO2 97%  BMI 38.28 kg/m  Ideal Body Weight: Weight in (lb) to have BMI = 25: 171.4  Ideal Body Weight: Weight in (lb) to have BMI = 25: 171.4 No exam data present Depression screen [facility] 2/9 06/14/2020 01/13/2019 11/03/2017  Decreased Interest 0 0 0  Down, Depressed, Hopeless 0 0 0  PHQ - 2 Score 0 0 0     GEN: well developed, well nourished, no acute distress Eyes: conjunctiva and lids normal, PERRLA, EOMI ENT: TM clear, nares clear, oral exam WNL Neck: supple, no lymphadenopathy, no thyromegaly, no JVD Pulm: clear to  auscultation and percussion, respiratory effort normal CV: regular rate and rhythm, S1-S2, no murmur, rub or gallop, no bruits, peripheral pulses normal and symmetric, no cyanosis, clubbing, edema or varicosities GI: soft, non-tender; no hepatosplenomegaly, masses; active bowel sounds all quadrants GU: deferred Lymph: no cervical, axillary or inguinal adenopathy MSK: gait normal, muscle tone and strength WNL, no joint swelling, effusions, discoloration, crepitus  SKIN: clear, good turgor, color WNL, no rashes, lesions, or ulcerations.  He does have a lipoma on the posterior aspect of his shoulder blade on the left. Neuro: normal mental status, normal strength, sensation, and motion Psych: alert; oriented to person, place and time, normally interactive and not anxious or depressed in appearance.  All labs reviewed with patient. Results for orders placed or performed in visit on 06/07/20  Lipid panel  Result Value Ref Range   Cholesterol 195 0 - 200 mg/dL   Triglycerides 27.0 0.0 - 149.0 mg/dL   HDL 35.00 >93.81 mg/dL   VLDL 82.9 0.0 - 93.7 mg/dL   LDL Cholesterol 169 (H) 0 - 99 mg/dL   Total CHOL/HDL Ratio 4    NonHDL 143.08   Hepatic function panel  Result Value Ref Range   Total Bilirubin 0.7 0.2 - 1.2 mg/dL   Bilirubin, Direct 0.2 0.0 - 0.3 mg/dL   Alkaline Phosphatase 64 39 - 117 U/L   AST 24 0 - 37 U/L   ALT 21 0 - 53 U/L   Total Protein 6.6 6.0 - 8.3 g/dL   Albumin 4.2 3.5 - 5.2 g/dL  Basic metabolic panel  Result Value Ref Range   Sodium 141 135 - 145 mEq/L   Potassium 4.2 3.5 - 5.1 mEq/L   Chloride 104 96 - 112 mEq/L   CO2 30 19 - 32 mEq/L   Glucose, Bld 110 (H) 70 - 99 mg/dL   BUN 18 6 - 23 mg/dL   Creatinine, Ser 6.78 0.40 - 1.50 mg/dL   GFR 93.81 >01.75 mL/min   Calcium 9.3 8.4 - 10.5 mg/dL  CBC with Differential/Platelet  Result Value Ref Range   WBC 6.9 4.0 - 10.5 K/uL   RBC 5.85 (H) 4.22 - 5.81 Mil/uL   Hemoglobin 15.3 13.0 - 17.0 g/dL   HCT 10.2 58.5 - 27.7  %   MCV 77.8 (L) 78.0 - 100.0 fl   MCHC 33.6 30.0 - 36.0 g/dL   RDW 82.4 23.5 - 36.1 %   Platelets 243.0 150.0 - 400.0 K/uL   Neutrophils Relative % 68.7 43.0 - 77.0 %   Lymphocytes Relative 21.0 12.0 - 46.0 %   Monocytes Relative 7.7 3.0 - 12.0 %   Eosinophils Relative 2.1 0.0 - 5.0 %   Basophils Relative 0.5 0.0 - 3.0 %   Neutro Abs 4.8 1.4 - 7.7 K/uL   Lymphs Abs 1.5 0.7 - 4.0 K/uL   Monocytes Absolute 0.5 0.1 - 1.0 K/uL   Eosinophils Absolute 0.1  0.0 - 0.7 K/uL   Basophils Absolute 0.0 0.0 - 0.1 K/uL  Hemoglobin A1c  Result Value Ref Range   Hgb A1c MFr Bld 5.9 4.6 - 6.5 %  PSA, Total with Reflex to PSA, Free  Result Value Ref Range   PSA, Total 0.2 < OR = 4.0 ng/mL  Microalbumin / creatinine urine ratio  Result Value Ref Range   Microalb, Ur 1.3 0.0 - 1.9 mg/dL   Creatinine,U 384.6 mg/dL   Microalb Creat Ratio 1.1 0.0 - 30.0 mg/g  Urinalysis, Routine w reflex microscopic  Result Value Ref Range   Color, Urine YELLOW Yellow;Lt. Yellow;Straw;Dark Yellow;Amber;Green;Red;Brown   APPearance CLEAR Clear;Turbid;Slightly Cloudy;Cloudy   Specific Gravity, Urine 1.020 1.000 - 1.030   pH 6.5 5.0 - 8.0   Total Protein, Urine NEGATIVE Negative   Urine Glucose NEGATIVE Negative   Ketones, ur NEGATIVE Negative   Bilirubin Urine NEGATIVE Negative   Hgb urine dipstick NEGATIVE Negative   Urobilinogen, UA 1.0 0.0 - 1.0   Leukocytes,Ua NEGATIVE Negative   Nitrite NEGATIVE Negative   WBC, UA 0-2/hpf 0-2/hpf   RBC / HPF none seen 0-2/hpf   Squamous Epithelial / LPF Rare(0-4/hpf) Rare(0-4/hpf)    Assessment and Plan:     ICD-10-CM   1. Healthcare maintenance  Z00.00    He is globally up-to-date and doing well.  I did recommend that he continue to work on his weight, eat well, cut down on his ice cream, and continue to try to be physically fit.  Health Maintenance Exam: The patient's preventative maintenance and recommended screening tests for an annual wellness exam were reviewed  in full today. Brought up to date unless services declined.  Counselled on the importance of diet, exercise, and its role in overall health and mortality. The patient's FH and SH was reviewed, including their home life, tobacco status, and drug and alcohol status.  Follow-up in 1 year for physical exam or additional follow-up below.  Follow-up: No follow-ups on file. Or follow-up in 1 year if not noted.  No orders of the defined types were placed in this encounter.  Medications Discontinued During This Encounter  Medication Reason  . 0.9 %  sodium chloride infusion Completed Course   No orders of the defined types were placed in this encounter.   Signed,  Elpidio Galea. Alvia Tory, MD   Allergies as of 06/14/2020   No Known Allergies     Medication List       Accurate as of June 14, 2020 11:48 AM. If you have any questions, ask your nurse or doctor.        aspirin 81 MG EC tablet Take 81 mg by mouth every other day. Swallow whole.   fluticasone 50 MCG/ACT nasal spray Commonly known as: FLONASE Place 2 sprays into both nostrils daily as needed for allergies or rhinitis.   losartan 100 MG tablet Commonly known as: COZAAR TAKE 1 TABLET(100 MG) BY MOUTH DAILY   multivitamin tablet Take 1 tablet by mouth daily.

## 2020-08-17 ENCOUNTER — Other Ambulatory Visit: Payer: Self-pay | Admitting: Family Medicine

## 2020-08-18 NOTE — Telephone Encounter (Signed)
Pharmacy requests refill on: Losartan 100 mg   LAST REFILL: 05/23/2020 (Q-90, R-0) LAST OV: 06/14/2020 NEXT OV: Not Scheduled  PHARMACY: [id number] [place], [place]

## 2020-09-21 ENCOUNTER — Telehealth: Payer: Self-pay

## 2020-09-21 ENCOUNTER — Encounter: Payer: Self-pay | Admitting: Family Medicine

## 2020-09-21 ENCOUNTER — Telehealth (INDEPENDENT_AMBULATORY_CARE_PROVIDER_SITE_OTHER): Payer: Federal, State, Local not specified - PPO | Admitting: Family Medicine

## 2020-09-21 ENCOUNTER — Telehealth: Payer: Self-pay | Admitting: Family Medicine

## 2020-09-21 VITALS — Temp 98.6°F

## 2020-09-21 DIAGNOSIS — U071 COVID-19: Secondary | ICD-10-CM | POA: Diagnosis not present

## 2020-09-21 MED ORDER — MOLNUPIRAVIR EUA 200MG CAPSULE: 4.0000 | ORAL_CAPSULE | Freq: Two times a day (BID) | ORAL | 0 refills | Status: AC

## 2020-09-21 MED ORDER — BENZONATATE 100 MG PO CAPS: 100.0000 mg | ORAL_CAPSULE | Freq: Three times a day (TID) | ORAL | 0 refills | Status: DC | PRN

## 2020-09-21 NOTE — Telephone Encounter (Signed)
Patient states that he just tested positive for covid and called to ask if the prescription for a cough medication and a covid medication could be sent in for him to [id number] - [place], [place] - [address]  He says that he just had a virtual visit today with Dr. Selena Batten.  Please advise.

## 2020-09-21 NOTE — Telephone Encounter (Signed)
See Dr. Elmyra Ricks note... antiviral already called in as well as benzonatate.  Also direct him to pt instruction on Mychart from Dr. Selena Batten.

## 2020-09-21 NOTE — Addendum Note (Signed)
Addended by: Terressa Koyanagi on: 09/21/2020 04:21 PM   Modules accepted: Orders

## 2020-09-21 NOTE — Telephone Encounter (Signed)
Patient is calling in stating he had a virtual visit today with Dr.Kim at [facility], was advised to take another test and call us back with results. Andrew Stout is positive and is wondering what the procedure is.

## 2020-09-21 NOTE — Patient Instructions (Addendum)
---------------------------------------------------------------------------------------------------------------------------    WORK SLIP:  Patient Andrew Stout,  05-22-1965, was seen for a medical visit today, 09/21/20 . Please excuse from work for a COVID like illness. IF covid test is positive advise 10 days rom the onset of symptoms (09/16/20) PLUS 1 day of no fever and improved symptoms. Will defer to employer for a sooner return to work if the covid test is negative or if symptoms have resolved, it is greater than 5 days since the positive test and the patient can wear a high-quality, tight fitting mask such as N95 or KN95 at all times for an additional 5 days. Would also suggest COVID19 antigen testing is negative prior to return.  Sincerely: E-signature: Dr. Colin Benton, DO [place] Ph: [phone]   ------------------------------------------------------------------------------------------------------------------------------    HOME CARE TIPS:  -[place] testing information: [email or web address]-[email or web address] OR [phone] Most pharmacies also offer testing and home test kits. If the Covid19 test is positive, please make a prompt follow up visit with your primary care office or with [facility] to discuss treatment options. Treatments for Covid19 are best given early in the course of the illness.   -I sent the medication(s) we discussed to your pharmacy: Meds ordered this encounter  Medications   benzonatate (TESSALON PERLES) 100 MG capsule    Sig: Take 1 capsule (100 mg total) by mouth 3 (three) times daily as needed.    Dispense:  20 capsule    Refill:  0     -can use tylenol or aleve if needed for fevers, aches and pains per instructions  -can use nasal saline a few times per day if you have nasal congestion; sometimes  a short course of Afrin nasal spray for 3 days can help with symptoms as  well  -stay hydrated, drink plenty of fluids and eat small healthy meals - avoid dairy  -can take 1000 IU (70mg) Vit D3 and 100-500 mg of Vit C daily per instructions  -If the Covid test is positive, check out the [facility] for more information on home care, transmission and treatment for COVID19  -follow up with your doctor in 2-3 days unless improving and feeling better  -stay home while sick, except to seek medical care. If you have COVID19, ideally it would be best to stay home for a full 10 days since the onset of symptoms PLUS one day of no fever and feeling better. Wear a good mask that fits snugly (such as N95 or KN95) if around others to reduce the risk of transmission.  It was nice to meet you today, and I really hope you are feeling better soon. I help [name] out with telemedicine visits on Tuesdays and Thursdays and am available for visits on those days. If you have any concerns or questions following this visit please schedule a follow up visit with your Primary Care doctor or seek care at a local urgent care clinic to avoid delays in care.    Seek in person care or schedule a follow up video visit promptly if your symptoms worsen, new concerns arise or you are not improving with treatment. Call 911 and/or seek emergency care if your symptoms are severe or life threatening.    Fact Sheet for Patients And Caregivers Emergency Use Authorization (EUA) Of LAGEVRIOT (molnupiravir) capsules For Coronavirus Disease 2019 (COVID-19)  What is the most important information I should know about LAGEVRIO? LAGEVRIO may cause serious side effects, including: ? LAGEVRIO may cause harm to  your unborn baby. It is not known if LAGEVRIO will harm your baby if you take LAGEVRIO during pregnancy. o LAGEVRIO is not recommended for use in pregnancy. o LAGEVRIO has not been studied in pregnancy. LAGEVRIO was studied in pregnant animals only. When LAGEVRIO was given to pregnant animals,  LAGEVRIO caused harm to their unborn babies. o You and your healthcare provider may decide that you should take LAGEVRIO during pregnancy if there are no other COVID-19 treatment options approved or authorized by the FDA that are accessible or clinically appropriate for you. o If you and your healthcare provider decide that you should take LAGEVRIO during pregnancy, you and your healthcare provider should discuss the known and potential benefits and the potential risks of taking LAGEVRIO during pregnancy. For individuals who are able to become pregnant: ? You should use a reliable method of birth control (contraception) consistently and correctly during treatment with LAGEVRIO and for 4 days after the last dose of LAGEVRIO. Talk to your healthcare provider about reliable birth control methods. ? Before starting treatment with [facility] your healthcare provider may do a pregnancy test to see if you are pregnant before starting treatment with LAGEVRIO. ? Tell your healthcare provider right away if you become pregnant or think you may be pregnant during treatment with LAGEVRIO. Pregnancy Surveillance Program: ? There is a pregnancy surveillance program for individuals who take LAGEVRIO during pregnancy. The purpose of this program is to collect information about the health of you and your baby. Talk to your healthcare provider about how to take part in this program. ? If you take LAGEVRIO during pregnancy and you agree to participate in the pregnancy surveillance program and allow your healthcare provider to share your information with [place], then your healthcare provider will report your use of [place] during pregnancy to [place]. by calling [phone] or PeacefulBlog.es. For individuals who are sexually active with partners who are able to become pregnant: ? It is not known if LAGEVRIO can affect sperm. While the risk is regarded as low,  animal studies to fully assess the potential for LAGEVRIO to affect the babies of males treated with LAGEVRIO have not been completed. A reliable method of birth control (contraception) should be used consistently and correctly during treatment with LAGEVRIO and for at least 3 months after the last dose. The risk to sperm beyond 3 months is not known. Studies to understand the risk to sperm beyond 3 months are ongoing. Talk to your healthcare provider about reliable birth control methods. Talk to your healthcare provider if you have questions or concerns about how LAGEVRIO may affect sperm. You are being given this fact sheet because your healthcare provider believes it is necessary to provide you with LAGEVRIO for the treatment of adults with mild-to-moderate coronavirus disease 2019 (COVID-19) with positive results of direct SARS-CoV-2 viral testing, and who are at high risk for progression to severe COVID-19 including hospitalization or death, and for whom other COVID-19 treatment options approved or authorized by the FDA are not accessible or clinically appropriate. The U.S. Food and Drug Administration (FDA) has issued an Emergency Use Authorization (EUA) to make LAGEVRIO available during the COVID-19 pandemic (for more details about an EUA please see "What is an Emergency Use Authorization?" at the end of this document). LAGEVRIO is not an FDA-approved medicine in the [place]. Read this Fact Sheet for information about LAGEVRIO. Talk to your healthcare provider about your options if you have any questions.  It is your choice to take LAGEVRIO.  What is COVID-19? COVID-19 is caused by a virus called a coronavirus. You can get COVID-19 through close contact with another person who has the virus. COVID-19 illnesses have ranged from very mild-to-severe, including illness resulting in death. While information so far suggests that most COVID-19 illness is mild, serious illness  can happen and may cause some of your other medical conditions to become worse. Older people and people of all ages with severe, long lasting (chronic) medical conditions like heart disease, lung disease and diabetes, for example seem to be at higher risk of being hospitalized for COVID-19.  What is LAGEVRIO? LAGEVRIO is an investigational medicine used to treat mild-to-moderate COVID-19 in adults: ? with positive results of direct SARS-CoV-2 viral testing, and ? who are at high risk for progression to severe COVID-19 including hospitalization or death, and for whom other COVID-19 treatment options approved or authorized by the FDA are not accessible or clinically appropriate. The FDA has authorized the emergency use of LAGEVRIO for the treatment of mild-tomoderate COVID-19 in adults under an EUA. For more information on EUA, see the "What is an Emergency Use Authorization (EUA)?" section at the end of this Fact Sheet. LAGEVRIO is not authorized: ? for use in people less than 25 years of age. ? for prevention of COVID-19. ? for people needing hospitalization for COVID-19. ? for use for longer than 5 consecutive days.  What should I tell my healthcare provider before I take LAGEVRIO? Tell your healthcare provider if you: ? Have any allergies ? Are breastfeeding or plan to breastfeed ? Have any serious illnesses ? Are taking any medicines (prescription, over-the-counter, vitamins, or herbal products).  How do I take LAGEVRIO? ? Take LAGEVRIO exactly as your healthcare provider tells you to take it. ? Take 4 capsules of LAGEVRIO every 12 hours (for example, at 8 am and at 8 pm) ? Take LAGEVRIO for 5 days. It is important that you complete the full 5 days of treatment with LAGEVRIO. Do not stop taking LAGEVRIO before you complete the full 5 days of treatment, even if you feel better. ? Take LAGEVRIO with or without food. ? You should stay in isolation for as long as your healthcare  provider tells you to. Talk to your healthcare provider if you are not sure about how to properly isolate while you have COVID-19. ? Swallow LAGEVRIO capsules whole. Do not open, break, or crush the capsules. If you cannot swallow capsules whole, tell your healthcare provider. ? What to do if you miss a dose: o If it has been less than 10 hours since the missed dose, take it as soon as you remember o If it has been more than 10 hours since the missed dose, skip the missed dose and take your dose at the next scheduled time. ? Do not double the dose of LAGEVRIO to make up for a missed dose.  What are the important possible side effects of LAGEVRIO? ? See, "What is the most important information I should know about LAGEVRIO?" ? Allergic Reactions. Allergic reactions can happen in people taking LAGEVRIO, even after only 1 dose. Stop taking LAGEVRIO and call your healthcare provider right away if you get any of the following symptoms of an allergic reaction: o hives o rapid heartbeat o trouble swallowing or breathing o swelling of the mouth, lips, or face o throat tightness o hoarseness o skin rash The most common side effects of LAGEVRIO are: ? diarrhea ?  nausea ? dizziness These are not all the possible side effects of LAGEVRIO. Not many people have taken LAGEVRIO. Serious and unexpected side effects may happen. This medicine is still being studied, so it is possible that all of the risks are not known at this time.  What other treatment choices are there?  Veklury (remdesivir) is FDA-approved as an intravenous (IV) infusion for the treatment of mildto-moderate [id number]-35 in certain adults and children. Talk with your doctor to see if Marijean Heath is appropriate for you. Like LAGEVRIO, FDA may also allow for the emergency use of other medicines to treat people with COVID-19. Go to  LacrosseProperties.si for more information. It is your choice to be treated or not to be treated with LAGEVRIO. Should you decide not to take it, it will not change your standard medical care.  What if I am breastfeeding? Breastfeeding is not recommended during treatment with LAGEVRIO and for 4 days after the last dose of LAGEVRIO. If you are breastfeeding or plan to breastfeed, talk to your healthcare provider about your options and specific situation before taking LAGEVRIO.  How do I report side effects with LAGEVRIO? Contact your healthcare provider if you have any side effects that bother you or do not go away. Report side effects to FDA MedWatch at SmoothHits.hu or call 1-800-FDA-1088 (1- 306-879-8639).  How should I store [place]? ? Store LAGEVRIO capsules at room temperature between 80F to 52F (20C to 25C). ? Keep LAGEVRIO and all medicines out of the reach of children and pets. How can I learn more about COVID-19? ? Ask your healthcare provider. ? Visit SeekRooms.co.uk ? Contact your local or state public health department. ? Call [place] at [phone] (toll free in the [place].S[place] ? Visit www.molnupiravir.com  What Is an Emergency Use Authorization (EUA)? The [place] FDA has made [place] available under an emergency access mechanism called an Emergency Use Authorization (EUA) The EUA is supported by a Presenter, broadcasting Health and Human Service (HHS) declaration that circumstances exist to justify emergency use of drugs and biological products during the COVID-19 pandemic. LAGEVRIO for the treatment of mild-to-moderate COVID-19 in adults with positive results of direct SARS-CoV-2 viral testing, who are at high risk for progression to severe COVID-19, including hospitalization or death, and for whom alternative COVID-19 treatment options approved or  authorized by FDA are not accessible or clinically appropriate, has not undergone the same type of review as an FDA-approved product. In issuing an EUA under the SPQZR-00 public health emergency, the FDA has determined, among other things, that based on the total amount of scientific evidence available including data from adequate and well-controlled clinical trials, if available, it is reasonable to believe that the product may be effective for diagnosing, treating, or preventing COVID-19, or a serious or life-threatening disease or condition caused by COVID-19; that the known and potential benefits of the product, when used to diagnose, treat, or prevent such disease or condition, outweigh the known and potential risks of such product; and that there are no adequate, approved, and available alternatives.  All of these criteria must be met to allow for the product to be used in the treatment of patients during the COVID-19 pandemic. The EUA for LAGEVRIO is in effect for the duration of the COVID-19 declaration justifying emergency use of LAGEVRIO, unless terminated or revoked (after which LAGEVRIO may no longer be used under the EUA). For patent information: http://rogers.info/ Copyright  2021-2022 [employer] [place], [place] and its affiliates.  All rights reserved. [id number] Revised: March 2022

## 2020-09-21 NOTE — Progress Notes (Addendum)
Virtual Visit via Video Note  I connected with Andrew Stout  on 09/21/20 at  3:00 PM EDT by a video enabled telemedicine application and verified that I am speaking with the correct person using two identifiers.  Location patient: home, [place] Location provider:work or home office Persons participating in the virtual visit: patient, provider  I discussed the limitations of evaluation and management by telemedicine and the availability of in person appointments. The patient expressed understanding and agreed to proceed.   HPI:  Acute telemedicine visit for : -Onset: 4-5 days ago -did a home covid test on day 1 of symptoms which was negative -Symptoms include:nasal congestion, cough, sore throat, headache, some body aches initially -doing much better today -Denies:fever, CP, SOB, NVD, inability to eat/drink/get out of bed -Has tried:musinex -Pertinent past medical history: allergies -Pertinent medication allergies: No Known Allergies -COVID-19 vaccine status: has had 2 vaccines and one booster  ROS: See pertinent positives and negatives per HPI.  Past Medical History:  Diagnosis Date   Allergy    seasonal   Chronic kidney disease    one kidney   Hypertension    Solitary kidney, congenital 05/10/2015    Past Surgical History:  Procedure Laterality Date   CHEST TUBE INSERTION     collasped lung in high school   NEPHROSTOMY  1975   left   TYMPANOSTOMY TUBE PLACEMENT  2010   right   VASECTOMY       Current Outpatient Medications:    aspirin 81 MG EC tablet, Take 81 mg by mouth every other day. Swallow whole., Disp: , Rfl:    benzonatate (TESSALON PERLES) 100 MG capsule, Take 1 capsule (100 mg total) by mouth 3 (three) times daily as needed., Disp: 20 capsule, Rfl: 0   fluticasone (FLONASE) 50 MCG/ACT nasal spray, Place 2 sprays into both nostrils daily as needed for allergies or rhinitis., Disp: , Rfl:    losartan (COZAAR) 100 MG tablet, TAKE 1 TABLET(100 MG) BY MOUTH DAILY, Disp:  90 tablet, Rfl: 1   Multiple Vitamin (MULTIVITAMIN) tablet, Take 1 tablet by mouth daily., Disp: , Rfl:   EXAM:  VITALS per patient if applicable:  GENERAL: alert, oriented, appears well and in no acute distress  HEENT: atraumatic, conjunttiva clear, no obvious abnormalities on inspection of external nose and ears  NECK: normal movements of the head and neck  LUNGS: on inspection no signs of respiratory distress, breathing rate appears normal, no obvious gross SOB, gasping or wheezing  CV: no obvious cyanosis  MS: moves all visible extremities without noticeable abnormality  PSYCH/NEURO: pleasant and cooperative, no obvious depression or anxiety, speech and thought processing grossly intact  ASSESSMENT AND PLAN:  Discussed the following assessment and plan:  COVID-19  -we discussed possible serious and likely etiologies, options for evaluation and workup, limitations of telemedicine visit vs in person visit, treatment, treatment risks and precautions. Pt prefers to treat via telemedicine empirically rather than in person at this moment. Query [facility], Covid with false negative early testing vs other. Since doing better he opted for repeat home covid test with isolation per guidelines if positive, nasal saline, short course nasal decongestant and tessalon rx for cough. Work/School slipped offered: provided in patient instructions   Scheduled follow up with PCP offered: as needed Advised to seek prompt in person care if worsening, new symptoms arise, or if is not improving with treatment. Discussed options for inperson care if PCP office not available. Did let this patient know that I only do  telemedicine on Tuesdays and Thursdays for [facility]. Advised to schedule follow up visit with PCP or [facility] if any further questions or concerns to avoid delays in care.   I discussed the assessment and treatment plan with the patient. The patient was provided an opportunity to ask questions and all were  answered. The patient agreed with the plan and demonstrated an understanding of the instructions.     Terressa Koyanagi, DO   Addendum: Patient called in to say he tested positive for covid after visit and wanted antiviral. I called patient.  Discussed treatment options, ideal treatment window, potential complications, isolation and precautions for COVID-19.  After lengthy discussion, the patient opted for treatment with molnupiravir due to being higher risk for complications of covid or severe disease and other factors. He opted against infusion or paxlovid after discussion. S/p vasctomy and denies any concerns for sexual activity resulting in pregnancy. Discussed EUA status of this drug and the fact that there is preliminary limited knowledge of risks/interactions/side effects per EUA document vs possible benefits and precautions. This information was shared with patient during the visit and also was provided in patient instructions. Also, advised that patient discuss risks/interactions and use with pharmacist/treatment team as well.

## 2020-09-22 NOTE — Telephone Encounter (Signed)
Mr. Griffith notified as instructed by telephone.  Patient states understanding.

## 2020-09-22 NOTE — Telephone Encounter (Signed)
Noted  

## 2021-01-15 DIAGNOSIS — M25531 Pain in right wrist: Secondary | ICD-10-CM | POA: Diagnosis not present

## 2021-03-18 ENCOUNTER — Other Ambulatory Visit: Payer: Self-pay | Admitting: Family Medicine

## 2021-04-25 ENCOUNTER — Emergency Department (HOSPITAL_COMMUNITY)
Admission: EM | Admit: 2021-04-25 | Discharge: 2021-04-25 | Disposition: A | Discharge status: Home / Self Care | Payer: Federal, State, Local not specified - PPO | Attending: Emergency Medicine | Admitting: Emergency Medicine

## 2021-04-25 ENCOUNTER — Other Ambulatory Visit: Payer: Self-pay

## 2021-04-25 ENCOUNTER — Encounter (HOSPITAL_COMMUNITY): Payer: Self-pay | Admitting: Emergency Medicine

## 2021-04-25 DIAGNOSIS — M545 Low back pain, unspecified: Secondary | ICD-10-CM | POA: Insufficient documentation

## 2021-04-25 DIAGNOSIS — M79605 Pain in left leg: Secondary | ICD-10-CM | POA: Diagnosis not present

## 2021-04-25 DIAGNOSIS — M791 Myalgia, unspecified site: Secondary | ICD-10-CM | POA: Insufficient documentation

## 2021-04-25 LAB — COMPREHENSIVE METABOLIC PANEL
ALT: 30 U/L (ref 0–44)
AST: 49 U/L — ABNORMAL HIGH (ref 15–41)
Albumin: 4.3 g/dL (ref 3.5–5.0)
Alkaline Phosphatase: 54 U/L (ref 38–126)
Anion gap: 6 (ref 5–15)
BUN: 17 mg/dL (ref 6–20)
CO2: 25 mmol/L (ref 22–32)
Calcium: 8.8 mg/dL — ABNORMAL LOW (ref 8.9–10.3)
Chloride: 104 mmol/L (ref 98–111)
Creatinine, Ser: 1.19 mg/dL (ref 0.61–1.24)
GFR, Estimated: >60 mL/min (ref >60)
Glucose, Bld: 119 mg/dL — ABNORMAL HIGH (ref 70–99)
Potassium: 3.3 mmol/L — ABNORMAL LOW (ref 3.5–5.1)
Sodium: 135 mmol/L (ref 135–145)
Total Bilirubin: 1.1 mg/dL (ref 0.3–1.2)
Total Protein: 7.5 g/dL (ref 6.5–8.1)

## 2021-04-25 LAB — CBC WITH DIFFERENTIAL/PLATELET
Abs Immature Granulocytes: 0.05 10*3/uL (ref 0.00–0.07)
Basophils Absolute: 0 10*3/uL (ref 0.0–0.1)
Basophils Relative: 1 %
Eosinophils Absolute: 0 10*3/uL (ref 0.0–0.5)
Eosinophils Relative: 0 %
HCT: 44 % (ref 39.0–52.0)
Hemoglobin: 14.6 g/dL (ref 13.0–17.0)
Immature Granulocytes: 1 %
Lymphocytes Relative: 11 %
Lymphs Abs: 0.8 10*3/uL (ref 0.7–4.0)
MCH: 25.7 pg — ABNORMAL LOW (ref 26.0–34.0)
MCHC: 33.2 g/dL (ref 30.0–36.0)
MCV: 77.6 fL — ABNORMAL LOW (ref 80.0–100.0)
Monocytes Absolute: 0.5 10*3/uL (ref 0.1–1.0)
Monocytes Relative: 6 %
Neutro Abs: 6.4 10*3/uL (ref 1.7–7.7)
Neutrophils Relative %: 81 %
Platelets: 272 10*3/uL (ref 150–400)
RBC: 5.67 MIL/uL (ref 4.22–5.81)
RDW: 13.5 % (ref 11.5–15.5)
WBC: 7.8 10*3/uL (ref 4.0–10.5)
nRBC: 0 % (ref 0.0–0.2)

## 2021-04-25 LAB — URINALYSIS, ROUTINE W REFLEX MICROSCOPIC
Bilirubin Urine: NEGATIVE
Glucose, UA: NEGATIVE mg/dL
Hgb urine dipstick: NEGATIVE
Ketones, ur: 5 mg/dL — AB
Leukocytes,Ua: NEGATIVE
Nitrite: NEGATIVE
Protein, ur: NEGATIVE mg/dL
Specific Gravity, Urine: 1.018 (ref 1.005–1.030)
pH: 5 (ref 5.0–8.0)

## 2021-04-25 LAB — CK
Total CK: 1768 U/L — ABNORMAL HIGH (ref 49–397)
Total CK: 1720 U/L — ABNORMAL HIGH (ref 49–397)

## 2021-04-25 LAB — MAGNESIUM: Magnesium: 2 mg/dL (ref 1.7–2.4)

## 2021-04-25 MED ORDER — OXYCODONE-ACETAMINOPHEN 5-325 MG PO TABS
1.0000 | ORAL_TABLET | Freq: Once | ORAL | Status: AC
  Administered 2021-04-25: 1 via ORAL
  Filled 2021-04-25: qty 1

## 2021-04-25 MED ORDER — LACTATED RINGERS IV BOLUS
1000.0000 mL | Freq: Once | INTRAVENOUS | Status: AC
Start: 2021-04-25 — End: 2021-04-25
  Administered 2021-04-25: 1000 mL via INTRAVENOUS

## 2021-04-25 MED ORDER — POTASSIUM CHLORIDE CRYS ER 20 MEQ PO TBCR
40.0000 meq | EXTENDED_RELEASE_TABLET | Freq: Once | ORAL | Status: AC
  Administered 2021-04-25: 40 meq via ORAL
  Filled 2021-04-25: qty 2

## 2021-04-25 MED ORDER — KETOROLAC TROMETHAMINE 15 MG/ML IJ SOLN
15.0000 mg | Freq: Once | INTRAMUSCULAR | Status: AC
Start: 2021-04-25 — End: 2021-04-25
  Administered 2021-04-25: 15 mg via INTRAVENOUS
  Filled 2021-04-25: qty 1

## 2021-04-25 NOTE — ED Provider Triage Note (Signed)
Emergency Medicine Provider Triage Evaluation Note  Andrew Stout , a 56 y.o. male  was evaluated in triage.  Pt complains of pain in back that started over the weekend. States pain is like a cramping discomfort.   Review of Systems  Positive: Back and leg pain Negative: Fever   Physical Exam  BP (!) 157/101 (BP Location: Right Arm)    Pulse 73    Temp 98.3 F (36.8 C) (Oral)    Resp 18    SpO2 98%  Gen:   Awake, no distress   Resp:  Normal effort  MSK:   Moves extremities without difficulty  Other:  No midline C/T/L spine TTP  Medical Decision Making  Medically screening exam initiated at 11:46 AM.  Appropriate orders placed.  Sanav K Guild was informed that the remainder of the evaluation will be completed by another provider, this initial triage assessment does not replace that evaluation, and the importance of remaining in the ED until their evaluation is complete.  CK, chemistry and cbc, Willette Cluster, [place] 04/25/21 1150

## 2021-04-25 NOTE — Discharge Instructions (Addendum)
Drink plenty of water and fluids to stay hydrated and to help flush out muscle enzymes that are present in your blood.  Treat pain at home with Tylenol and ibuprofen as needed.  If you have persistent pain, please follow-up with your primary care doctor for further testing.  Return to the emergency department for worsening severity of symptoms.

## 2021-04-25 NOTE — ED Triage Notes (Signed)
Reports L lower back pain that radiates down to his L thigh. States this started with a 'bug' had loose stools but these have resolved. States when he walks his leg might just give out. Has had to catch himself to stop from falling.

## 2021-04-25 NOTE — ED Notes (Signed)
Pt states understanding of dc instructions, importance of follow up. Pt denies questions or concerns and requested transportation assistance upon dc. Pt was transported by wheelchair and no belongings left in room upon dc.

## 2021-04-25 NOTE — ED Provider Notes (Signed)
[facility] DEPT Provider Note   CSN: [id number] Arrival date & time: 04/25/21  1117     History  Chief Complaint  Patient presents with   Leg Pain   Back Pain    Andrew Stout is a 56 y.o. male.   Leg Pain Associated symptoms: back pain   Back Pain Associated symptoms: leg pain   Patient presents for muscle spasms and pain in the areas of his lower back and proximal left leg.  He denies any recent episodes of heavy exertion, prolonged exercise activity, or heat exposure.  He does frequently go to the gym.  His recent workouts have been like they have always been.  Several days ago, with felt like muscle spasms in his bilateral lower back.  Spasms radiated into his left thigh.  Since that time, he has had ongoing soreness.  Recent fevers, chills, nausea, abdominal pain, dysuria, or hematuria.    Home Medications Prior to Admission medications   Medication Sig Start Date End Date Taking? Authorizing Provider  aspirin 81 MG EC tablet Take 81 mg by mouth every other day. Swallow whole.    [provider]  benzonatate (TESSALON PERLES) 100 MG capsule Take 1 capsule (100 mg total) by mouth 3 (three) times daily as needed. 09/21/20   Lucretia Kern, DO  fluticasone (FLONASE) 50 MCG/ACT nasal spray Place 2 sprays into both nostrils daily as needed for allergies or rhinitis.    [provider]  losartan (COZAAR) 100 MG tablet TAKE 1 TABLET(100 MG) BY MOUTH DAILY 03/20/21   Copland, Frederico Hamman, MD  Multiple Vitamin (MULTIVITAMIN) tablet Take 1 tablet by mouth daily.    [provider]      Allergies    Patient has no known allergies.    Review of Systems   Review of Systems  Musculoskeletal:  Positive for back pain and myalgias.  All other systems reviewed and are negative.  Physical Exam Updated Vital Signs BP (!) 143/100    Pulse 75    Temp 98.3 F (36.8 C) (Oral)    Resp 17    SpO2 99%  Physical Exam Vitals and nursing  note reviewed.  Constitutional:      General: He is not in acute distress.    Appearance: Normal appearance. He is well-developed and normal weight. He is not ill-appearing, toxic-appearing or diaphoretic.  HENT:     Head: Normocephalic and atraumatic.     Right Ear: External ear normal.     Left Ear: External ear normal.     Nose: Nose normal.     Mouth/Throat:     Mouth: Mucous membranes are moist.     Pharynx: Oropharynx is clear.  Eyes:     General: No scleral icterus.    Extraocular Movements: Extraocular movements intact.     Conjunctiva/sclera: Conjunctivae normal.  Cardiovascular:     Rate and Rhythm: Normal rate and regular rhythm.     Heart sounds: No murmur heard. Pulmonary:     Effort: Pulmonary effort is normal. No respiratory distress.     Breath sounds: Normal breath sounds. No wheezing or rales.  Abdominal:     Palpations: Abdomen is soft.     Tenderness: There is no abdominal tenderness. There is no right CVA tenderness or left CVA tenderness.  Musculoskeletal:        General: Tenderness (Mild tenderness to left thigh) present. No swelling or deformity. Normal range of motion.     Cervical back:  Normal range of motion and neck supple.     Right lower leg: No edema.     Left lower leg: No edema.  Skin:    General: Skin is warm and dry.     Capillary Refill: Capillary refill takes less than 2 seconds.     Coloration: Skin is not jaundiced or pale.  Neurological:     General: No focal deficit present.     Mental Status: He is alert and oriented to person, place, and time.     Cranial Nerves: No cranial nerve deficit.     Sensory: No sensory deficit.     Motor: No weakness.     Coordination: Coordination normal.  Psychiatric:        Mood and Affect: Mood normal.        Behavior: Behavior normal.        Thought Content: Thought content normal.        Judgment: Judgment normal.    ED Results / Procedures / Treatments   Labs (all labs ordered are listed,  but only abnormal results are displayed) Labs Reviewed  CK - Abnormal; Notable for the following components:      Result Value   Total CK 1,768 (*)    All other components within normal limits  CBC WITH DIFFERENTIAL/PLATELET - Abnormal; Notable for the following components:   MCV 77.6 (*)    MCH 25.7 (*)    All other components within normal limits  COMPREHENSIVE METABOLIC PANEL - Abnormal; Notable for the following components:   Potassium 3.3 (*)    Glucose, Bld 119 (*)    Calcium 8.8 (*)    AST 49 (*)    All other components within normal limits  CK - Abnormal; Notable for the following components:   Total CK 1,720 (*)    All other components within normal limits  URINALYSIS, ROUTINE W REFLEX MICROSCOPIC - Abnormal; Notable for the following components:   Ketones, ur 5 (*)    All other components within normal limits  MAGNESIUM    EKG None  Radiology No results found.  Procedures Procedures    Medications Ordered in ED Medications  oxyCODONE-acetaminophen (PERCOCET/ROXICET) 5-325 MG per tablet 1 tablet (1 tablet Oral Given 04/25/21 1204)  lactated ringers bolus 1,000 mL (0 mLs Intravenous Stopped 04/25/21 1629)  potassium chloride SA (KLOR-CON M) CR tablet 40 mEq (40 mEq Oral Given 04/25/21 1424)  ketorolac (TORADOL) 15 MG/ML injection 15 mg (15 mg Intravenous Given 04/25/21 1554)    ED Course/ Medical Decision Making/ A&P                           Medical Decision Making Amount and/or Complexity of Data Reviewed Labs: ordered.  Risk Prescription drug management.   This patient presents to the ED for concern of myalgias, this involves an extensive number of treatment options, and is a complaint that carries with it a high risk of complications and morbidity.  The differential diagnosis includes dehydration, electrolyte abnormalities, rhabdomyolysis, myositis   Co morbidities that complicate the patient evaluation  HTN, congenital solitary kidney   Additional  history obtained:  Additional history obtained from N/A External records from outside source obtained and reviewed including EMR   Lab Tests:  I Ordered, and personally interpreted labs.  The pertinent results include: Elevated CK, baseline creatinine, slight hypokalemia  Cardiac Monitoring:  The patient was maintained on a cardiac monitor.  I personally viewed and  interpreted the cardiac monitored which showed an underlying rhythm of: Sinus rhythm   Medicines ordered and prescription drug management:  I ordered medication including IV fluids for dehydration, potassium chloride for hypokalemia oxycodone and Toradol for analgesia Reevaluation of the patient after these medicines showed that the patient improved I have reviewed the patients home medicines and have made adjustments as needed   Problem List / ED Course:  56 year old male presenting to the ED following episodes of muscle spasms in his back and left thigh and persistent soreness to his left thigh.  Vital signs on arrival are notable for mild hypertension.  He is well-appearing on exam.  He has good strength in all his musculature.  He has mild tenderness to the area of his left thigh.  Prior being bedded in the ED, lab work was obtained.  Lab work was notable for a modestly elevated CK.  It is unclear of why the patient has an elevated CK.  He denies any recent strenuous activities, heat exposure, or viral illnesses.  Although CK is elevated, patient's urine shows no evidence of myoglobinuria and his creatinine is actually lower than baseline.  Given possible dehydration as etiology for muscle cramps, patient was given IV fluids.  On his lab work, he had mild hypokalemia.  This may have also contributed to his recent muscle cramps and replacement potassium was given.  While in the ED, patient was given oxycodone and Toradol for analgesia.  A repeat CK was ordered.  This is actually drawn prior to initiation of IV fluids.  Results  showed slight downtrend.  Given no elevation in second CK, normal creatinine, and well appearance, patient is stable for discharge at this time.  He was advised to continue to drink plenty of fluids at home.  He was also advised to seek follow-up appointment if he does have any persistence of his symptoms.  Patient may have an undiagnosed rare cause of a myositis.  At this time, he is stable for discharge.   Reevaluation:  After the interventions noted above, I reevaluated the patient and found that they have :improved   Dispostion:  After consideration of the diagnostic results and the patients response to treatment, I feel that the patent would benefit from discharge.          Final Clinical Impression(s) / ED Diagnoses Final diagnoses:  Myalgia    Rx / DC Orders ED Discharge Orders     None         Godfrey Pick, MD 04/27/21 1626

## 2021-04-30 ENCOUNTER — Ambulatory Visit: Payer: Federal, State, Local not specified - PPO | Admitting: Family Medicine

## 2021-04-30 ENCOUNTER — Other Ambulatory Visit: Payer: Self-pay

## 2021-04-30 ENCOUNTER — Encounter: Payer: Self-pay | Admitting: Family Medicine

## 2021-04-30 VITALS — BP 140/86 | HR 81 | Temp 97.5°F | Ht 69.5 in | Wt 262.1 lb

## 2021-04-30 DIAGNOSIS — M791 Myalgia, unspecified site: Secondary | ICD-10-CM

## 2021-04-30 DIAGNOSIS — R748 Abnormal levels of other serum enzymes: Secondary | ICD-10-CM | POA: Diagnosis not present

## 2021-04-30 DIAGNOSIS — M5126 Other intervertebral disc displacement, lumbar region: Secondary | ICD-10-CM

## 2021-04-30 MED ORDER — PREDNISONE 20 MG PO TABS: ORAL_TABLET | ORAL | 0 refills | Status: DC

## 2021-04-30 NOTE — Progress Notes (Signed)
Akina Maish T. Sacramento Monds, MD, CAQ Sports Medicine [facility] at [facility] [address] [place], [address]  Phone: [phone]   FAX: [phone]  Andrew Stout - 56 y.o. male   MRN [id number]   Date of Birth: [birth year]  Date: 04/30/2021   PCP: Hannah Beat, MD   Referral: Hannah Beat, MD  Chief Complaint  Patient presents with   Hospitalization Follow-up    Seen on 04/25/21 at [facility] ED, dx myalgia.      This visit occurred during the SARS-CoV-2 public health emergency.  Safety protocols were in place, including screening questions prior to the visit, additional usage of staff PPE, and extensive cleaning of exam room while observing appropriate contact time as indicated for disinfecting solutions.   Subjective:   Andrew Stout is a 56 y.o. very pleasant male patient with Body mass index is 38.15 kg/m. who presents with the following:  He is here for ER follow-up.  He presented there on April 25, 2021 with some back pain as well as musculoskeletal leg pain.  In the ER, he did have an elevated CK at 1700.  He had not been doing any kind of strenuous activities, heat exposure, or any, recent illnesses.  His creatinine had remained normal.  He was deemed stable for follow-up in outpatient setting.  What he describes to me as an acute onset of back pain with some radicular symptoms down the left side.  He has had some occasional numbness laterally on the lower extremity and pain going down his leg.  Str is getting better in L > R. Was having some back pain and then radicular pain all the way to his LE. Felt numb.s  He is working out, had been  - felt an acute pain like being shot in the back.     Review of Systems is noted in the HPI, as appropriate  Objective:   BP 140/86    Pulse 81    Temp (!) 97.5 F (36.4 C) (Temporal)    Ht 5' 9.5" (1.765 m)    Wt 262 lb 2 oz (118.9 kg)    SpO2 97%    BMI 38.15 kg/m    Range of motion at   the waist: Flexion, extension, lateral bending and rotation: Relatively preserved.  No echymosis or edema Rises to examination table with mild difficulty Gait: minimally antalgic  Inspection/Deformity: N Paraspinus Tenderness: Left greater than right, L3-S1  B Ankle Dorsiflexion (L5,4): 5/5 B Great Toe Dorsiflexion (L5,4): 5/5 Heel Walk (L5): WNL Toe Walk (S1): WNL Rise/Squat (L4): WNL, mild pain  SENSORY B Medial Foot (L4): WNL B Dorsum (L5): WNL B Lateral (S1): Decreased, light touch and pinprick  REFLEXES Knee (L4): 2+ Ankle (S1): 2+  B SLR, seated: neg B SLR, supine: neg B FABER: Some back pain  B Reverse FABER: neg B Greater Troch: NT B Log Roll: neg B Sciatic Notch: NT   Laboratory and Imaging Data:  Assessment and Plan:     ICD-10-CM   1. Herniated nucleus pulposus, lumbar  M51.26     2. Myalgia  M79.10 Basic metabolic panel    CK    3. Elevated CK  R74.8 Basic metabolic panel    CK     History and exam consistent with disc herniation.  Acute, abrupt onset.  Radicular pain with some left lower extremity lateral numbness.  Most likely L4-L5 versus L5-S1.  He is already improving.  Pulse of steroids.  Basic motion.  Doubtful this has anything to do with an elevated CK, but I will recheck this.  Renal function was normal in the ER.  Meds ordered this encounter  Medications   predniSONE (DELTASONE) 20 MG tablet    Sig: 2 tabs po daily for 5 days, then 1 tab po daily for 5 days    Dispense:  15 tablet    Refill:  0   Medications Discontinued During This Encounter  Medication Reason   benzonatate (TESSALON PERLES) 100 MG capsule Completed Course   Orders Placed This Encounter  Procedures   Basic metabolic panel   CK    Follow-up: No follow-ups on file.  Dragon [employer] One speech-to-text software was used for transcription in this dictation.  Possible transcriptional errors can occur using Animal nutritionist.   Signed,  Elpidio Galea. Alie Moudy,  MD   Outpatient Encounter Medications as of 04/30/2021  Medication Sig   aspirin 81 MG EC tablet Take 81 mg by mouth every other day. Swallow whole.   fluticasone (FLONASE) 50 MCG/ACT nasal spray Place 2 sprays into both nostrils daily as needed for allergies or rhinitis.   losartan (COZAAR) 100 MG tablet TAKE 1 TABLET(100 MG) BY MOUTH DAILY   Multiple Vitamin (MULTIVITAMIN) tablet Take 1 tablet by mouth daily.   predniSONE (DELTASONE) 20 MG tablet 2 tabs po daily for 5 days, then 1 tab po daily for 5 days   [DISCONTINUED] benzonatate (TESSALON PERLES) 100 MG capsule Take 1 capsule (100 mg total) by mouth 3 (three) times daily as needed.   No facility-administered encounter medications on file as of 04/30/2021.

## 2021-05-01 ENCOUNTER — Encounter: Payer: Self-pay | Admitting: Family Medicine

## 2021-05-01 LAB — BASIC METABOLIC PANEL
BUN: 15 mg/dL (ref 6–23)
CO2: 27 mEq/L (ref 19–32)
Calcium: 9.5 mg/dL (ref 8.4–10.5)
Chloride: 102 mEq/L (ref 96–112)
Creatinine, Ser: 1.28 mg/dL (ref 0.40–1.50)
GFR: 63.27 mL/min (ref >60.00)
Glucose, Bld: 103 mg/dL — ABNORMAL HIGH (ref 70–99)
Potassium: 4.1 mEq/L (ref 3.5–5.1)
Sodium: 138 mEq/L (ref 135–145)

## 2021-05-01 LAB — CK: Total CK: 469 U/L — ABNORMAL HIGH (ref 7–232)

## 2021-05-15 ENCOUNTER — Telehealth: Payer: Self-pay | Admitting: Family Medicine

## 2021-05-15 NOTE — Telephone Encounter (Signed)
Please triage

## 2021-05-15 NOTE — Telephone Encounter (Addendum)
I spoke with pt; pt was seen [facility] ED on 04/25/21 lt lower back pain that radiated to thigh and pt had ED FU on 04/30/21. Pt said back not aching now; the dull throbbing pain starts in lt groin and runs down the lt leg behind the knee to the shin. Pt also described a tight feeling in his lt leg with the pain as well.Pt said the pain comes and goes but when has pain it last for hours. Pt said the numbness in lt leg is a lot better than when seen on 04/30/21.the last time pt had the pain was last night and pain level was 7. Pt said he goes to the gym 3 x a wk with using treadmill and crunch and curl machines. 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 Pt denies any injury. Pt said the prednisone did help pain while pt was on medication and the pain started coming back about 2 days after  finishing the prednisone. Pt said he does have difficulty walking with the pain and pt said he limps all the time even when not in severe pain. Pt does not have any swelling, redness or warmth to the leg. Pt has been alternating ice and heat to the leg. Pt said now he is in no major distress and pt scheduled appt with Dr Patsy Lager on 05/16/21 at 12 noon. UC & ED precautions given and pt voiced understanding. Sending note to Dr Patsy Lager and Lupita Leash CMA.

## 2021-05-15 NOTE — Telephone Encounter (Signed)
Mr. Andrew Stout called in and stated that he is having terrible nerve pain and he wanted to know if he could get something called in due to the raditating pain the tylenol is not helping and he is icing and heating but if he moves the pain starts shooting down his legs

## 2021-05-16 ENCOUNTER — Other Ambulatory Visit: Payer: Self-pay

## 2021-05-16 ENCOUNTER — Ambulatory Visit: Payer: Federal, State, Local not specified - PPO | Admitting: Family Medicine

## 2021-05-16 ENCOUNTER — Ambulatory Visit (INDEPENDENT_AMBULATORY_CARE_PROVIDER_SITE_OTHER)
Admission: RE | Admit: 2021-05-16 | Discharge: 2021-05-16 | Disposition: A | Discharge status: Home / Self Care | Payer: Federal, State, Local not specified - PPO | Source: Ambulatory Visit | Attending: Family Medicine | Admitting: Family Medicine

## 2021-05-16 ENCOUNTER — Encounter: Payer: Self-pay | Admitting: Family Medicine

## 2021-05-16 VITALS — BP 140/90 | HR 80 | Temp 98.4°F | Ht 69.5 in | Wt 265.1 lb

## 2021-05-16 DIAGNOSIS — M5126 Other intervertebral disc displacement, lumbar region: Secondary | ICD-10-CM | POA: Diagnosis not present

## 2021-05-16 DIAGNOSIS — M5416 Radiculopathy, lumbar region: Secondary | ICD-10-CM | POA: Diagnosis not present

## 2021-05-16 DIAGNOSIS — I700 Atherosclerosis of aorta: Secondary | ICD-10-CM | POA: Diagnosis not present

## 2021-05-16 DIAGNOSIS — Q7649 Other congenital malformations of spine, not associated with scoliosis: Secondary | ICD-10-CM | POA: Diagnosis not present

## 2021-05-16 MED ORDER — PREDNISONE 20 MG PO TABS: ORAL_TABLET | ORAL | 0 refills | Status: DC

## 2021-05-16 MED ORDER — CYCLOBENZAPRINE HCL 10 MG PO TABS: 10.0000 mg | ORAL_TABLET | Freq: Every evening | ORAL | 1 refills | Status: DC | PRN

## 2021-05-16 NOTE — Progress Notes (Signed)
? ? ?Darian Cansler T. Nevin Grizzle, MD, CAQ Sports Medicine ?Nature conservation officer at [facility] ?[address] [place] ?[place], [address] ? ?Phone: [phone]  FAX: [phone] ? ?Andrew Stout - 56 y.o. male  MRN [id number]  Date of Birth: October 18, 1965 ? ?Date: 05/16/2021  PCP: Hannah Beat, MD  Referral: Hannah Beat, MD ? ?Chief Complaint  ?Patient presents with  ? Leg Pain  ?  Left  ? ? ?This visit occurred during the SARS-CoV-2 public health emergency.  Safety protocols were in place, including screening questions prior to the visit, additional usage of staff PPE, and extensive cleaning of exam room while observing appropriate contact time as indicated for disinfecting solutions.  ? ?Subjective:  ? ?Andrew Stout is a 56 y.o. very pleasant male patient with Body mass index is 38.58 kg/m?. who presents with the following: ? ?F/u lumbar radiculopathy: ? ?I last saw him on 04/30/2021. ? ?Numbness is getting better, but with some radicular pain down the L side.  Will go off and on, when on his feet will start with the L side, and rad.  It is still very painful and limiting his ability to bend and pick things up as well as in his rotational capacity.  The radicular pain is the primary issue right now. ? 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  He does try to stretch and move about, but he often is having trouble walking.  At the end of the day he is having some very significant pain. ? ?He is also having a great deal of pain sleeping.  He is not sleeping much at all. ? ?Initially, did give him 10 days worth of steroids, and this did help with his symptoms, but not completely, and they did return. ? ?Review of Systems is noted in the HPI, as appropriate  ? ?Objective:  ? ?BP 140/90   Pulse 80   Temp 98.4 ?F (36.9 ?C) (Oral)   Ht 5' 9.5" (1.765 m)   Wt 265 lb 1 oz (120.2 kg)   SpO2 96%   BMI  38.58 kg/m?  ? ? ?Range of motion at  the waist: Flexion, extension, lateral bending and rotation: Limited in forward flexion to 55 degrees.  Extension causes pain, lateral bending and rotational movements are normal. ? ?No echymosis or edema ?Rises to examination table with mild difficulty ?Gait: minimally antalgic ? ?Inspection/Deformity: N ?Paraspinus Tenderness: L4-S1 bilaterally ? ?B Ankle Dorsiflexion (L5,4): 5/5 ?B Great Toe Dorsiflexion (L5,4): 5/5 ?Heel Walk (L5): WNL ?Toe Walk (S1): WNL ?Rise/Squat (L4): WNL, mild pain ? ?SENSORY ?B Medial Foot (L4): WNL ?B Dorsum (L5): WNL ?B Lateral (S1): He is still having some numbness to pinprick in light touch on the lateral lower extremity ? ?B SLR, seated: neg ?B SLR, supine: neg ?B FABER: neg ?B Reverse FABER: neg ?B Greater Troch: NT ?B Log Roll: neg ?B Sciatic Notch: NT  ? ?Radiology: ?DG Lumbar Spine Complete ? ?Result Date: 05/18/2021 ?CLINICAL DATA:  Left leg radiculopathy EXAM: LUMBAR SPINE - COMPLETE 4+ VIEW COMPARISON:  None. FINDINGS: Partial sacralization L5. There is no evidence of lumbar spine fracture. Alignment is normal. Intervertebral disc spaces are maintained. Minimal atherosclerotic calcification within the abdominal aorta. IMPRESSION: No acute fracture or listhesis. Electronically Signed   By: Helyn Numbers M.D.   On: 05/18/2021 00:17   ? ?Assessment and Plan:  ? ?  ICD-10-CM   ?1. Left lumbar radiculopathy  M54.16 DG Lumbar Spine Complete  ?  Ambulatory referral to Physical Therapy  ?  ?2. Herniated nucleus pulposus, lumbar  M51.26 DG Lumbar Spine Complete  ?  Ambulatory referral to Physical Therapy  ?  ? ?Ongoing left-sided lumbar radiculopathy.  Numbness is improving, but he is still having some severe pain and radicular symptoms. ? ?I am going to have him start doing some physical therapy. ? ?X-rays are normal essentially from an orthopedic standpoint. ? ?Extend steroids to 14 days, add Flexeril at night for pain and to assist with  sleeping. ? ?We does pain limitations, i am gonna to give him an additional note for work. ? ?Follow-up with me in 4 weeks ? ?Meds ordered this encounter  ?Medications  ? predniSONE (DELTASONE) 20 MG tablet  ?  Sig: 2 tabs po for 7 days, then 1 tab po for 7 days  ?  Dispense:  21 tablet  ?  Refill:  0  ? cyclobenzaprine (FLEXERIL) 10 MG tablet  ?  Sig: Take 1 tablet (10 mg total) by mouth at bedtime as needed for muscle spasms.  ?  Dispense:  30 tablet  ?  Refill:  1  ? ?Medications Discontinued During This Encounter  ?Medication Reason  ? aspirin 81 MG EC tablet Completed Course  ? predniSONE (DELTASONE) 20 MG tablet Completed Course  ? ?Orders Placed This Encounter  ?Procedures  ? DG Lumbar Spine Complete  ? Ambulatory referral to Physical Therapy  ? ? ?Follow-up: No follow-ups on file. ? ?Dragon [employer] One speech-to-text software was used for transcription in this dictation.  Possible transcriptional errors can occur using Animal nutritionist.  ? ?Signed, ? ?Jaasiel Hollyfield T. Aarilyn Dye, MD ? ? ?Outpatient Encounter Medications as of 05/16/2021  ?Medication Sig  ? cyclobenzaprine (FLEXERIL) 10 MG tablet Take 1 tablet (10 mg total) by mouth at bedtime as needed for muscle spasms.  ? fluticasone (FLONASE) 50 MCG/ACT nasal spray Place 2 sprays into both nostrils daily as needed for allergies or rhinitis.  ? losartan (COZAAR) 100 MG tablet TAKE 1 TABLET(100 MG) BY MOUTH DAILY  ? Multiple Vitamin (MULTIVITAMIN) tablet Take 1 tablet by mouth daily.  ? predniSONE (DELTASONE) 20 MG tablet 2 tabs po for 7 days, then 1 tab po for 7 days  ? [DISCONTINUED] aspirin 81 MG EC tablet Take 81 mg by mouth every other day. Swallow whole.  ? [DISCONTINUED] predniSONE (DELTASONE) 20 MG tablet 2 tabs po daily for 5 days, then 1 tab po daily for 5 days  ? ?No facility-administered encounter medications on file as of 05/16/2021.  ?  ?

## 2021-05-21 ENCOUNTER — Encounter: Payer: Self-pay | Admitting: Family Medicine

## 2021-05-21 NOTE — Telephone Encounter (Signed)
I know this will be on the work que, but I wanted to send to you per his message. ?

## 2021-05-23 NOTE — Therapy (Signed)
OUTPATIENT PHYSICAL THERAPY THORACOLUMBAR EVALUATION   Patient Name: Andrew Stout MRN: [id number] DOB:1965/12/21, 56 y.o., male Today's Date: 05/24/2021   PT End of Session - 05/24/21 1654     Visit Number 1    Date for PT Re-Evaluation 06/27/21    Authorization Type BCBS    Authorization Time Period tbd    PT Start Time 1545    PT Stop Time 1630    PT Time Calculation (min) 45 min    Activity Tolerance Patient tolerated treatment well    Behavior During Therapy WFL for tasks assessed/performed             Past Medical History:  Diagnosis Date   Allergy    seasonal   Chronic kidney disease    one kidney   Hypertension    Solitary kidney, congenital 05/10/2015   Past Surgical History:  Procedure Laterality Date   CHEST TUBE INSERTION     collasped lung in high school   NEPHROSTOMY  1975   left   TYMPANOSTOMY TUBE PLACEMENT  2010   right   VASECTOMY     Patient Active Problem List   Diagnosis Date Noted   Solitary kidney, congenital 05/10/2015   Right carpal tunnel syndrome 01/01/2013   Essential hypertension 03/07/2009    PCP: Hannah Beat, MD  REFERRING PROVIDER: Hannah Beat, MD  REFERRING DIAG:  M51.26 (ICD-10-CM) - Herniated nucleus pulposus, lumbar  M54.16 (ICD-10-CM) - Left lumbar radiculopathy    THERAPY DIAG:  Radiculopathy, lumbar region  Muscle weakness (generalized)  Other abnormalities of gait and mobility  ONSET DATE: February 2023  SUBJECTIVE:                                                                                                                                                                                           SUBJECTIVE STATEMENT: "I stepped out of the work truck and it felt like an Lobbyist shock in my back. I felt fine the rest of the day, but I woke up the next day feeling stiff. I have pain in the left lower back area and I get some numbness in the front of my left calf. Crossing my ankles in  sitting can help relieve my leg pain. Feels tight in the front of my left shin." Patient reports he will be returning to work next Wednesday. PERTINENT HISTORY:  Patient went to the ED 04/25/21 with episodes of muscle spasms in his back and left thigh. Patient enjoys working out prior to this.   PAIN:  Are you having pain? Yes NPRS scale: 5/10 tightness in left leg Pain location: anterior left shin  Pain orientation: Left  PAIN TYPE: tight and pulling in front of left leg Pain description: intermittent  Aggravating factors: sitting for 1 hour and lifting weights Relieving factors: Ice  PRECAUTIONS: None  WEIGHT BEARING RESTRICTIONS No  FALLS:  Has patient fallen in last 6 months? Yes, Number of falls: 1 (left leg gave out and fell on ground)  LIVING ENVIRONMENT: Lives with: lives with their family Lives in: House/apartment Stairs: Yes; External: 6 steps; on right going up Has following equipment at home: None  OCCUPATION: mail handler  PLOF: Independent  PATIENT GOALS "Get range of motion back and get back to me."   OBJECTIVE:   DIAGNOSTIC FINDINGS:  Lumbar X-ray: negative  PATIENT SURVEYS:  FOTO 51, predicted: 73  SCREENING FOR RED FLAGS: Bowel or bladder incontinence: No Cauda equina syndrome: No  COGNITION:  Overall cognitive status: Within functional limits for tasks assessed     SENSATION:  Light touch: Appears intact   POSTURE:  Shifts weight over onto right LE  PALPATION: No tenderness to palpation  LUMBARAROM/PROM  A/PROM A/PROM  05/24/2021  Flexion Toe touch  Extension 20  Right lateral flexion 25  Left lateral flexion 25  Right rotation   Left rotation    (Blank rows = not tested)  LE AROM/PROM:  A/PROM Right 05/24/2021 Left 05/24/2021  Hip flexion 100 100  Hip extension    Hip abduction    Hip adduction    Hip internal rotation    Hip external rotation    Knee flexion    Knee extension    Ankle dorsiflexion    Ankle  plantarflexion    Ankle inversion    Ankle eversion     (Blank rows = not tested)  LE MMT:  MMT Right 05/24/2021 Left 05/24/2021  Hip flexion 4+ 3+  Hip extension 4- 4-  Hip abduction 4 4-  Hip adduction    Hip internal rotation 4+ 4+  Hip external rotation 4- 4  Knee flexion 5 5  Knee extension 5 4+  Ankle dorsiflexion 5 5  Ankle plantarflexion    Ankle inversion    Ankle eversion     (Blank rows = not tested)  LUMBAR SPECIAL TESTS:  Straight leg raise test: Negative, Slump test: Negative, SI Compression/distraction test: Negative, and Thomas test: Positive Repeated lumbar flexion: - Repeated lumbar extension: reduced left LE radicular sx  FUNCTIONAL TESTS:  5 times sit to stand: 20 seconds Timed up and go (TUG): 14 seconds Demonstrated improper body mechanics with lifting and reaches for weight far beyond BOS  GAIT: Distance walked: 10 ft Comments: reduced stride length of left LE and reduced toe off    PATIENT EDUCATION:  Education details: Educated on findings in evaluation and HEP. Provided handout for HEP. Discussed and demonstrated proper lifting mechanics.  Person educated: Patient Education method: Explanation, Demonstration, Tactile cues, Verbal cues, and Handouts Education comprehension: verbalized understanding   HOME EXERCISE PROGRAM: Access Code: [email or web address] URL: [email or web address] Date: 05/24/2021 Prepared by: Johny ShearsAshley Aaren Krog  Exercises Supine Quadriceps Stretch with Strap on Table - 1 x daily - 7 x weekly - 3 sets - 30 seconds hold Sidelying Hip Abduction - 1 x daily - 7 x weekly - 2 sets - 10 reps Prone Press Up On Elbows - 1 x daily - 7 x weekly - 3 sets - 10 seconds hold Prone Press Up - 1 x daily - 7 x weekly - 3 sets - 10 seconds hold Left peroneal nerve glides throughout the  day to reduce radicular sx in anterior lower extremity  ASSESSMENT:  CLINICAL IMPRESSION: Patient is a 56 y.o. male who was seen today for physical  therapy evaluation and treatment for herniated nucleus pulposus. He demonstrates tightness in left anterior hip and thigh musculature, and this impacts his gait with reduced stride length and reduced toe off of left LE. He demonstrates weakness in left hip musculature and positive findings for herniated nucleus per his reports of reduced radicular sx following repetitive lumbar extension. Light touch sensation was intact bilaterally. Patient will benefit from skilled PT services to address impairments and reduce radicular pain to perform ADLs and work tasks.    OBJECTIVE IMPAIRMENTS Abnormal gait, decreased activity tolerance, difficulty walking, decreased strength, improper body mechanics, and pain.   ACTIVITY LIMITATIONS community activity, yard work, and work tasks .   PERSONAL FACTORS 1-2 comorbidities: HTN and chronic kidney disease  are also affecting patient's functional outcome.    REHAB POTENTIAL: Good  CLINICAL DECISION MAKING: Stable/uncomplicated  EVALUATION COMPLEXITY: Low   GOALS: Goals reviewed with patient? No  SHORT TERM GOALS:  Patient will be independent with HEP for PT progression.  Baseline: initial HEP provided Target date: 06/07/2021 Goal status: INITIAL   LONG TERM GOALS:  Patient will score a 73 on his FOTO.  Baseline: 51 Target date: 06/28/2021 Goal status: INITIAL  2.  Patient will demonstrate awareness and improved proper body mechanics when lifting 5 lbs.  Baseline: stands farther from weight and reaches froward beyond BOS Target date: 06/28/2021 Goal status: INITIAL  3.  Patient will demonstrate >/= 4+/5 MMT for left hip musculature. Baseline: 3+ left hip FL MMT Target date: 06/28/2021 Goal status: INITIAL  4.  Patient will report <1/10 left leg pain when sitting or performing work activities such as lifting. Baseline: 9/10 after a work day Target date: 06/28/2021 Goal status: INITIAL   PLAN: PT FREQUENCY: 1-2x/week  PT DURATION: other: 5  weeks  PLANNED INTERVENTIONS: Therapeutic exercises, Therapeutic activity, Neuromuscular re-education, Balance training, Gait training, Patient/Family education, Joint manipulation, Joint mobilization, Stair training, Dry Needling, Cryotherapy, Moist heat, and Manual therapy  PLAN FOR NEXT SESSION: Core and hip strengthening. Anterior left hip stretches. Extension based lumbar exercises.    Curly Rim, PT, DPT 05/24/2021, 4:58 PM

## 2021-05-24 ENCOUNTER — Other Ambulatory Visit: Payer: Self-pay

## 2021-05-24 ENCOUNTER — Ambulatory Visit: Payer: Federal, State, Local not specified - PPO | Attending: Family Medicine

## 2021-05-24 DIAGNOSIS — M6281 Muscle weakness (generalized): Secondary | ICD-10-CM | POA: Diagnosis not present

## 2021-05-24 DIAGNOSIS — R2689 Other abnormalities of gait and mobility: Secondary | ICD-10-CM | POA: Diagnosis not present

## 2021-05-24 DIAGNOSIS — M5126 Other intervertebral disc displacement, lumbar region: Secondary | ICD-10-CM | POA: Insufficient documentation

## 2021-05-24 DIAGNOSIS — M5416 Radiculopathy, lumbar region: Secondary | ICD-10-CM | POA: Insufficient documentation

## 2021-05-28 ENCOUNTER — Ambulatory Visit: Payer: Federal, State, Local not specified - PPO

## 2021-05-28 ENCOUNTER — Other Ambulatory Visit: Payer: Self-pay

## 2021-05-28 DIAGNOSIS — M5126 Other intervertebral disc displacement, lumbar region: Secondary | ICD-10-CM | POA: Diagnosis not present

## 2021-05-28 DIAGNOSIS — M5416 Radiculopathy, lumbar region: Secondary | ICD-10-CM | POA: Diagnosis not present

## 2021-05-28 DIAGNOSIS — M6281 Muscle weakness (generalized): Secondary | ICD-10-CM

## 2021-05-28 DIAGNOSIS — R2689 Other abnormalities of gait and mobility: Secondary | ICD-10-CM | POA: Diagnosis not present

## 2021-05-28 NOTE — Therapy (Signed)
?OUTPATIENT PHYSICAL THERAPY TREATMENT NOTE ? ? ?Patient Name: Andrew Stout ?MRN: [id number] ?DOB:[birth year], 56 y.o., male ?Today's Date: 05/28/2021 ? ?PCP: Andrew Stout, Spencer, MD ?REFERRING PROVIDER: Hannah Stout, Spencer, MD ? ? PT End of Session - 05/28/21 1409   ? ? Visit Number 2   ? Date for PT Re-Evaluation 06/27/21   ? Authorization Type BCBS   ? PT Start Time 1410   ? PT Stop Time 1457   ? PT Time Calculation (min) 47 min   ? Activity Tolerance Patient tolerated treatment well   ? [facility] tasks assessed/performed   ? ?  ?  ? ?  ? ? ?Past Medical History:  ?Diagnosis Date  ? Allergy   ? seasonal  ? Chronic kidney disease   ? one kidney  ? Hypertension   ? Solitary kidney, congenital 05/10/2015  ? ?Past Surgical History:  ?Procedure Laterality Date  ? CHEST TUBE INSERTION    ? collasped lung in high school  ? NEPHROSTOMY  1975  ? left  ? TYMPANOSTOMY TUBE PLACEMENT  2010  ? right  ? VASECTOMY    ? ?Patient Active Problem List  ? Diagnosis Date Noted  ? Solitary kidney, congenital 05/10/2015  ? Right carpal tunnel syndrome 01/01/2013  ? Essential hypertension 03/07/2009  ? ? ?REFERRING DIAG:  ?M51.26 (ICD-10-CM) - Herniated nucleus pulposus, lumbar  ?M54.16 (ICD-10-CM) - Left lumbar radiculopathy  ? ? ?THERAPY DIAG:  ?Radiculopathy, lumbar region ? ?Muscle weakness (generalized) ? ?Other abnormalities of gait and mobility ? ?PERTINENT HISTORY: Patient went to the ED 04/25/21 with episodes of muscle spasms in his back and left thigh. Patient enjoys working out prior to this.  ? ?PRECAUTIONS: none ? ?SUBJECTIVE: "I had my first day of no pain today." ? ?PAIN:  ?Are you having pain? Yes: NPRS scale: 0/10 ?Pain location: none ?Pain description: none ?Aggravating factors: bending over to pick something up from floor made left knee feel weak ?Relieving factors: none   ? ? ? ?OBJECTIVE:  ?  ?DIAGNOSTIC FINDINGS:  ?Lumbar X-ray: negative ?  ?PATIENT SURVEYS:  ?FOTO 51, predicted: 5473 ?  ?SCREENING  FOR RED FLAGS: ?Bowel or bladder incontinence: No ?Cauda equina syndrome: No ?  ?COGNITION: ?         Overall cognitive status: Within functional limits for tasks assessed              ?          ?SENSATION: ?         Light touch: Appears intact ?  ?  ?POSTURE:  ?Shifts weight over onto right LE ?  ?PALPATION: ?No tenderness to palpation ?  ?LUMBARAROM/PROM ?  ?A/PROM A/PROM  ?05/24/2021  ?Flexion Toe touch  ?Extension 20  ?Right lateral flexion 25  ?Left lateral flexion 25  ?Right rotation    ?Left rotation    ? (Blank rows = not tested) ?  ?LE AROM/PROM: ?  ?A/PROM Right ?05/24/2021 Left ?05/24/2021  ?Hip flexion 100 100  ?Hip extension      ?Hip abduction      ?Hip adduction      ?Hip internal rotation      ?Hip external rotation      ?Knee flexion      ?Knee extension      ?Ankle dorsiflexion      ?Ankle plantarflexion      ?Ankle inversion      ?Ankle eversion      ? (Blank rows =  not tested) ?  ?LE MMT: ?  ?MMT Right ?05/24/2021 Left ?05/24/2021  ?Hip flexion 4+ 3+  ?Hip extension 4- 4-  ?Hip abduction 4 4-  ?Hip adduction      ?Hip internal rotation 4+ 4+  ?Hip external rotation 4- 4  ?Knee flexion 5 5  ?Knee extension 5 4+  ?Ankle dorsiflexion 5 5  ?Ankle plantarflexion      ?Ankle inversion      ?Ankle eversion      ? (Blank rows = not tested) ?  ?LUMBAR SPECIAL TESTS:  ?Straight leg raise test: Negative, Slump test: Negative, SI Compression/distraction test: Negative, and Thomas test: Positive ?Repeated lumbar flexion: - ?Repeated lumbar extension: reduced left LE radicular sx ?  ?FUNCTIONAL TESTS:  ?5 times sit to stand: 20 seconds ?Timed up and go (TUG): 14 seconds ?Demonstrated improper body mechanics with lifting and reaches for weight far beyond BOS ?  ?GAIT: ?Distance walked: 10 ft ?Comments: reduced stride length of left LE and reduced toe off ?  ?TODAY'S TREATMENT: ? ? [facility] Adult PT Treatment:                                                DATE: 05/28/21 ?Therapeutic Exercise: ?NuStep level 4 for 4 min LE  only ?Prone on elbow x2 min ?Prone press ups x2 min ?Bilateral clamshells 2x12 bil ?Right side lying hip ABD 1x10 ?Knee rockers in hook lying x10 each way ?SLR left LE 2 x 10 ?Sit to stands from standard height chair 1x10 ?Supine quad stretch with strap 4x20 sec ? ?Neuromuscular re-ed: ?Nerve glides x12 left LE ?Prone PPT 10 x 5 sec holds ?PPT with marches 1 x 10  ?LE lifts in quadruped 1x10 ?Pallof press 2x10 GTB ?  ? ?PATIENT EDUCATION:  ?Education details: Educated on findings in evaluation and HEP. Provided handout for HEP. Discussed and demonstrated proper lifting mechanics.  ?Person educated: Patient ?Education method: Explanation, Demonstration, Tactile cues, Verbal cues, and Handouts ?Education comprehension: verbalized understanding ?  ?  ?HOME EXERCISE PROGRAM: ?Access Code: [email or web address] ?URL: [email or web address] ?Date: 05/28/2021 ?Prepared by: Johny Shears ? ?Exercises ?Supine Quadriceps Stretch with Strap on Table - 1 x daily - 7 x weekly - 3 sets - 30 seconds hold ?Sidelying Hip Abduction - 1 x daily - 7 x weekly - 2 sets - 10 reps ?Prone Press Up On Elbows - 1 x daily - 7 x weekly - 3 sets - 10 seconds hold ?Prone Press Up - 1 x daily - 7 x weekly - 3 sets - 10 seconds hold ?Supine Straight Leg Raises - 1 x daily - 7 x weekly - 2 sets - 10 reps ?Clamshell - 1 x daily - 7 x weekly - 2 sets - 12 reps ?Supine Posterior Pelvic Tilt - 1 x daily - 7 x weekly - 2 sets - 10 reps ? ? ?ASSESSMENT: ?  ?CLINICAL IMPRESSION: ?Andrew Stout arrived to PT session with 0/10 pain and reduced radicular sx noticed down left LE. Session focused on core and hip strengthening. He requires frequent verbal and tactile cues when performing TrA activation exercises. He continues to shift his weight over onto his right LE when standing and performing sit to stands.  ?  ?  ?OBJECTIVE IMPAIRMENTS Abnormal gait, decreased activity tolerance, difficulty walking, decreased strength, improper body mechanics, and pain.  ?   ?  ACTIVITY LIMITATIONS community activity, yard work, and work tasks .  ?  ?PERSONAL FACTORS 1-2 comorbidities: HTN and chronic kidney disease  are also affecting patient's functional outcome.  ?  ?  ?REHAB POTENTIAL: Good ?  ?CLINICAL DECISION MAKING: Stable/uncomplicated ?  ?EVALUATION COMPLEXITY: Low ?  ?  ?GOALS: ?Goals reviewed with patient? No ?  ?SHORT TERM GOALS: ?  ?Patient will be independent with HEP for PT progression.  ?Baseline: initial HEP provided ?Target date: 06/07/2021 ?Goal status: INITIAL ?  ?  ?LONG TERM GOALS: ?  ?Patient will score a 73 on his FOTO.  ?Baseline: 51 ?Target date: 06/28/2021 ?Goal status: INITIAL ?  ?2.  Patient will demonstrate awareness and improved proper body mechanics when lifting 5 lbs.  ?Baseline: stands farther from weight and reaches froward beyond BOS ?Target date: 06/28/2021 ?Goal status: INITIAL ?  ?3.  Patient will demonstrate >/= 4+/5 MMT for left hip musculature. ?Baseline: 3+ left hip FL MMT ?Target date: 06/28/2021 ?Goal status: INITIAL ?  ?4.  Patient will report <1/10 left leg pain when sitting or performing work activities such as lifting. ?Baseline: 9/10 after a work day ?Target date: 06/28/2021 ?Goal status: INITIAL ?  ?  ?PLAN: ?PT FREQUENCY: 1-2x/week ?  ?PT DURATION: other: 5 weeks ?  ?PLANNED INTERVENTIONS: Therapeutic exercises, Therapeutic activity, Neuromuscular re-education, Balance training, Gait training, Patient/Family education, Joint manipulation, Joint mobilization, Stair training, Dry Needling, Cryotherapy, Moist heat, and Manual therapy ?  ?PLAN FOR NEXT SESSION: Core and hip strengthening. Anterior left hip stretches. Extension based lumbar exercises. Add single leg stance on left LE or single leg sit to stand.  ? ? ?Curly Rim, PT, DPT ?05/28/2021, 3:04 PM ? ?  ? ?

## 2021-06-05 NOTE — Therapy (Signed)
?OUTPATIENT PHYSICAL THERAPY TREATMENT NOTE ? ? ?Patient Name: Andrew Stout ?MRN: [id number] ?DOB:April 29, 1965, 56 y.o., male ?Today's Date: 06/07/2021 ? ?PCP: Owens Loffler, MD ?REFERRING PROVIDER: Owens Loffler, MD ? ? PT End of Session - 06/07/21 1504   ? ? Visit Number 3   ? Date for PT Re-Evaluation 06/27/21   ? Authorization Type BCBS   ? PT Start Time [id number]   ? PT Stop Time [id number]   ? PT Time Calculation (min) 40 min   ? Activity Tolerance Patient tolerated treatment well   ? Behavior During Therapy [facility] for tasks assessed/performed   ? ?  ?  ? ?  ? ? ? ?Past Medical History:  ?Diagnosis Date  ? Allergy   ? seasonal  ? Chronic kidney disease   ? one kidney  ? Hypertension   ? Solitary kidney, congenital 05/10/2015  ? ?Past Surgical History:  ?Procedure Laterality Date  ? CHEST TUBE INSERTION    ? collasped lung in high school  ? NEPHROSTOMY  1975  ? left  ? TYMPANOSTOMY TUBE PLACEMENT  2010  ? right  ? VASECTOMY    ? ?Patient Active Problem List  ? Diagnosis Date Noted  ? Solitary kidney, congenital 05/10/2015  ? Right carpal tunnel syndrome 01/01/2013  ? Essential hypertension 03/07/2009  ? ? ?REFERRING DIAG:  ?M51.26 (ICD-10-CM) - Herniated nucleus pulposus, lumbar  ?M54.16 (ICD-10-CM) - Left lumbar radiculopathy  ? ? ?THERAPY DIAG:  ?Radiculopathy, lumbar region ? ?Muscle weakness (generalized) ? ?Other abnormalities of gait and mobility ? ?PERTINENT HISTORY: Patient went to the ED 04/25/21 with episodes of muscle spasms in his back and left thigh. Patient enjoys working out prior to this.  ? ?PRECAUTIONS: none ? ?SUBJECTIVE: "Feeling good today. I feel stiff behind the left knee and ankle occasionally. I get stiff laying down at night." ? ?PAIN:  ?Are you having pain? no ?Relieving factors: stretches ? ?OBJECTIVE:  ?  ?DIAGNOSTIC FINDINGS:  ?Lumbar X-ray: negative ?  ?PATIENT SURVEYS:  ?FOTO 51, predicted: 43 ?  ?SCREENING FOR RED FLAGS: ?Bowel or bladder incontinence: No ?Cauda equina syndrome: No ?   ?COGNITION: ?         Overall cognitive status: Within functional limits for tasks assessed              ?          ?SENSATION: ?         Light touch: Appears intact ?  ?  ?POSTURE:  ?Shifts weight over onto right LE ?  ?PALPATION: ?No tenderness to palpation ?  ?LUMBARAROM/PROM ?  ?A/PROM A/PROM  ?05/24/2021  ?Flexion Toe touch  ?Extension 20  ?Right lateral flexion 25  ?Left lateral flexion 25  ?Right rotation    ?Left rotation    ? (Blank rows = not tested) ?  ?LE AROM/PROM: ?  ?A/PROM Right ?05/24/2021 Left ?05/24/2021  ?Hip flexion 100 100  ?Hip extension      ?Hip abduction      ?Hip adduction      ?Hip internal rotation      ?Hip external rotation      ?Knee flexion      ?Knee extension      ?Ankle dorsiflexion      ?Ankle plantarflexion      ?Ankle inversion      ?Ankle eversion      ? (Blank rows = not tested) ?  ?LE MMT: ?  ?MMT Right ?05/24/2021 Left ?05/24/2021  ?Hip flexion  4+ 3+  ?Hip extension 4- 4-  ?Hip abduction 4 4-  ?Hip adduction      ?Hip internal rotation 4+ 4+  ?Hip external rotation 4- 4  ?Knee flexion 5 5  ?Knee extension 5 4+  ?Ankle dorsiflexion 5 5  ?Ankle plantarflexion      ?Ankle inversion      ?Ankle eversion      ? (Blank rows = not tested) ?  ?LUMBAR SPECIAL TESTS:  ?Straight leg raise test: Negative, Slump test: Negative, SI Compression/distraction test: Negative, and Thomas test: Positive ?Repeated lumbar flexion: - ?Repeated lumbar extension: reduced left LE radicular sx ?  ?FUNCTIONAL TESTS:  ?5 times sit to stand: 20 seconds ?Timed up and go (TUG): 14 seconds ?Demonstrated improper body mechanics with lifting and reaches for weight far beyond BOS ?  ?GAIT: ?Distance walked: 10 ft ?Comments: reduced stride length of left LE and reduced toe off ?  ?TODAY'S TREATMENT: ? ? ?[facility] Adult PT Treatment:                                                DATE: 06/07/21 ?Therapeutic Exercise: ?NuStep level 4 for 4 min LE only ?Resisted sidestepping RTB 3x length of table ?SLR left LE 2 x 10 ?Prone on  elbows x2 min ?Sit to stands from standard height chair 1x10 with 10 lb dumbbell ?Supine quad stretch with strap 3x30 sec ?Neuromuscular re-ed: ?Prone PPT 10 x 5 sec holds ?PPT with marches 1 x 10  ?LE lifts in quadruped 12x10 ?Pallof press 2x15 GTB doubled up ? ?[facility] Adult PT Treatment:                                                DATE: 05/28/21 ?Therapeutic Exercise: ?NuStep level 4 for 4 min LE only ?Prone on elbow x2 min ?Prone press ups x2 min ?Bilateral clamshells 2x12 bil ?Right side lying hip ABD 1x10 ?Knee rockers in hook lying x10 each way ?SLR left LE 2 x 10 ?Sit to stands from standard height chair 1x10 ?Supine quad stretch with strap 4x20 sec ? ?Neuromuscular re-ed: ?Nerve glides x12 left LE ?Prone PPT 10 x 5 sec holds ?PPT with marches 1 x 10  ?LE lifts in quadruped 1x10 ?Pallof press 2x10 GTB ?  ? ?PATIENT EDUCATION:  ?Education details: Educated on findings in evaluation and HEP. Provided handout for HEP. Discussed and demonstrated proper lifting mechanics.  ?Person educated: Patient ?Education method: Explanation, Demonstration, Tactile cues, Verbal cues, and Handouts ?Education comprehension: verbalized understanding ?  ?  ?HOME EXERCISE PROGRAM: ? ?Access Code: [email or web address] ?URL: [email or web address] ?Date: 06/07/2021 ?Prepared by: Edythe Lynn ? ?Exercises ?- Supine Quadriceps Stretch with Strap on Table  - 1 x daily - 7 x weekly - 3 sets - 30 seconds hold ?- Sidelying Hip Abduction  - 1 x daily - 7 x weekly - 2 sets - 10 reps ?- Prone Press Up On Elbows  - 1 x daily - 7 x weekly - 3 sets - 10 seconds hold ?- Prone Press Up  - 1 x daily - 7 x weekly - 3 sets - 10 seconds hold ?- Supine Straight Leg Raises  - 1 x daily - 7 x weekly -  2 sets - 10 reps ?- Clamshell  - 1 x daily - 7 x weekly - 2 sets - 12 reps ?- Supine Posterior Pelvic Tilt  - 1 x daily - 7 x weekly - 2 sets - 10 reps ?- Side Stepping with Resistance at Ankles  - 1 x daily - 7 x weekly ? ? ? ? ?ASSESSMENT: ?   ?CLINICAL IMPRESSION: ?Domnic arrived to PT with 0/10 pain today. He tolerated the addition of standing exercises without any pain reproduction. He requires verbal cueing for TrA activation exercises. He had more difficulty performing the palloff press when utilizing left core. Add single leg activities in next session. ?  ?  ?OBJECTIVE IMPAIRMENTS Abnormal gait, decreased activity tolerance, difficulty walking, decreased strength, improper body mechanics, and pain.  ?  ?ACTIVITY LIMITATIONS community activity, yard work, and work tasks .  ?  ?PERSONAL FACTORS 1-2 comorbidities: HTN and chronic kidney disease  are also affecting patient's functional outcome.  ?  ?  ?REHAB POTENTIAL: Good ?  ?CLINICAL DECISION MAKING: Stable/uncomplicated ?  ?EVALUATION COMPLEXITY: Low ?  ?  ?GOALS: ?Goals reviewed with patient? No ?  ?SHORT TERM GOALS: ?  ?Patient will be independent with HEP for PT progression.  ?Baseline: initial HEP provided ?Target date: 06/07/2021 ?Goal status: INITIAL ?  ?  ?LONG TERM GOALS: ?  ?Patient will score a 73 on his FOTO.  ?Baseline: 51 ?Target date: 06/28/2021 ?Goal status: INITIAL ?  ?2.  Patient will demonstrate awareness and improved proper body mechanics when lifting 5 lbs.  ?Baseline: stands farther from weight and reaches froward beyond BOS ?Target date: 06/28/2021 ?Goal status: INITIAL ?  ?3.  Patient will demonstrate >/= 4+/5 MMT for left hip musculature. ?Baseline: 3+ left hip FL MMT ?Target date: 06/28/2021 ?Goal status: INITIAL ?  ?4.  Patient will report <1/10 left leg pain when sitting or performing work activities such as lifting. ?Baseline: 9/10 after a work day ?Target date: 06/28/2021 ?Goal status: INITIAL ?  ?  ?PLAN: ?PT FREQUENCY: 1-2x/week ?  ?PT DURATION: other: 5 weeks ?  ?PLANNED INTERVENTIONS: Therapeutic exercises, Therapeutic activity, Neuromuscular re-education, Balance training, Gait training, Patient/Family education, Joint manipulation, Joint mobilization, Stair training,  Dry Needling, Cryotherapy, Moist heat, and Manual therapy ?  ?PLAN FOR NEXT SESSION: Core and hip strengthening. Anterior left hip stretches. Extension based lumbar exercises. Add single leg stance on left LE

## 2021-06-07 ENCOUNTER — Ambulatory Visit: Payer: Federal, State, Local not specified - PPO

## 2021-06-07 ENCOUNTER — Other Ambulatory Visit: Payer: Self-pay

## 2021-06-07 DIAGNOSIS — M5126 Other intervertebral disc displacement, lumbar region: Secondary | ICD-10-CM | POA: Diagnosis not present

## 2021-06-07 DIAGNOSIS — M5416 Radiculopathy, lumbar region: Secondary | ICD-10-CM

## 2021-06-07 DIAGNOSIS — R2689 Other abnormalities of gait and mobility: Secondary | ICD-10-CM

## 2021-06-07 DIAGNOSIS — M6281 Muscle weakness (generalized): Secondary | ICD-10-CM | POA: Diagnosis not present

## 2021-06-12 ENCOUNTER — Other Ambulatory Visit: Payer: Self-pay

## 2021-06-12 ENCOUNTER — Encounter: Payer: Self-pay | Admitting: Physical Therapy

## 2021-06-12 ENCOUNTER — Ambulatory Visit: Payer: Federal, State, Local not specified - PPO | Admitting: Physical Therapy

## 2021-06-12 DIAGNOSIS — M5126 Other intervertebral disc displacement, lumbar region: Secondary | ICD-10-CM | POA: Diagnosis not present

## 2021-06-12 DIAGNOSIS — M6281 Muscle weakness (generalized): Secondary | ICD-10-CM | POA: Diagnosis not present

## 2021-06-12 DIAGNOSIS — R2689 Other abnormalities of gait and mobility: Secondary | ICD-10-CM

## 2021-06-12 DIAGNOSIS — M5416 Radiculopathy, lumbar region: Secondary | ICD-10-CM

## 2021-06-12 NOTE — Therapy (Signed)
?OUTPATIENT PHYSICAL THERAPY TREATMENT NOTE ? ? ?Patient Name: Andrew Stout ?MRN: [id number] ?DOB:1966-01-02, 56 y.o., male ?Today's Date: 06/12/2021 ? ?PCP: Owens Loffler, MD ?REFERRING PROVIDER: Owens Loffler, MD ? ? PT End of Session - 06/12/21 1453   ? ? Visit Number 4   ? Number of Visits 10   ? Date for PT Re-Evaluation 06/27/21   ? Authorization Type BCBS   ? PT Start Time 1450   ? PT Stop Time 1530   ? PT Time Calculation (min) 40 min   ? Activity Tolerance Patient tolerated treatment well   ? Behavior During Therapy [facility] for tasks assessed/performed   ? ?  ?  ? ?  ? ? ? ? ?Past Medical History:  ?Diagnosis Date  ? Allergy   ? seasonal  ? Chronic kidney disease   ? one kidney  ? Hypertension   ? Solitary kidney, congenital 05/10/2015  ? ?Past Surgical History:  ?Procedure Laterality Date  ? CHEST TUBE INSERTION    ? collasped lung in high school  ? NEPHROSTOMY  1975  ? left  ? TYMPANOSTOMY TUBE PLACEMENT  2010  ? right  ? VASECTOMY    ? ?Patient Active Problem List  ? Diagnosis Date Noted  ? Solitary kidney, congenital 05/10/2015  ? Right carpal tunnel syndrome 01/01/2013  ? Essential hypertension 03/07/2009  ? ? ?REFERRING DIAG:  ?M51.26 (ICD-10-CM) - Herniated nucleus pulposus, lumbar  ?M54.16 (ICD-10-CM) - Left lumbar radiculopathy  ? ? ?THERAPY DIAG:  ?Radiculopathy, lumbar region ? ?Muscle weakness (generalized) ? ?Other abnormalities of gait and mobility ? ?PERTINENT HISTORY: Patient went to the ED 04/25/21 with episodes of muscle spasms in his back and left thigh. Patient enjoys working out prior to this.  ? ?PRECAUTIONS: none ? ?SUBJECTIVE: "I do get some soreness when I am laying on my side at night. I get some of the LLE tenderness" ? ?PAIN:  ?Are you having pain? No ? ? ?OBJECTIVE:  ?  ?DIAGNOSTIC FINDINGS:  ?Lumbar X-ray: negative ?  ?PATIENT SURVEYS:  ?FOTO 51, predicted: 64 ?  ?SCREENING FOR RED FLAGS: ?Bowel or bladder incontinence: No ?Cauda equina syndrome: No ?  ?COGNITION: ?          Overall cognitive status: Within functional limits for tasks assessed              ?          ?SENSATION: ?         Light touch: Appears intact ?  ?  ?POSTURE:  ?Shifts weight over onto right LE ?  ?PALPATION: ?No tenderness to palpation ?  ?LUMBARAROM/PROM ?  ?A/PROM A/PROM  ?05/24/2021  ?Flexion Toe touch  ?Extension 20  ?Right lateral flexion 25  ?Left lateral flexion 25  ?Right rotation    ?Left rotation    ? (Blank rows = not tested) ?  ?LE AROM/PROM: ?  ?A/PROM Right ?05/24/2021 Left ?05/24/2021  ?Hip flexion 100 100  ?Hip extension      ?Hip abduction      ?Hip adduction      ?Hip internal rotation      ?Hip external rotation      ?Knee flexion      ?Knee extension      ?Ankle dorsiflexion      ?Ankle plantarflexion      ?Ankle inversion      ?Ankle eversion      ? (Blank rows = not tested) ?  ?LE MMT: ?  ?MMT  Right ?05/24/2021 Left ?05/24/2021  ?Hip flexion 4+ 3+  ?Hip extension 4- 4-  ?Hip abduction 4 4-  ?Hip adduction      ?Hip internal rotation 4+ 4+  ?Hip external rotation 4- 4  ?Knee flexion 5 5  ?Knee extension 5 4+  ?Ankle dorsiflexion 5 5  ?Ankle plantarflexion      ?Ankle inversion      ?Ankle eversion      ? (Blank rows = not tested) ?  ?LUMBAR SPECIAL TESTS:  ?Straight leg raise test: Negative, Slump test: Negative, SI Compression/distraction test: Negative, and Thomas test: Positive ?Repeated lumbar flexion: - ?Repeated lumbar extension: reduced left LE radicular sx ?  ?FUNCTIONAL TESTS:  ?5 times sit to stand: 20 seconds ?Timed up and go (TUG): 14 seconds ?Demonstrated improper body mechanics with lifting and reaches for weight far beyond BOS ?  ?GAIT: ?Distance walked: 10 ft ?Comments: reduced stride length of left LE and reduced toe off ?  ?TODAY'S TREATMENT: ? ?[facility] PT Treatment:                                                DATE: 06/12/2021 ?Therapeutic Exercise: ?PNF contract/ relax hamstring stretch 2 x 30 sec ?SLR LLE 2 x 12 ?Single knee to chest 2 x 30 sec LLE only ?Dead bug 10 x holding  10 sec, combined with sustained posterior pelvic ?LTR 1 x 20 ?Manual Therapy: ?MET of the L hip flexor activation utilizing scissor technique ?Sciatic nerve glides with hamstring stretch and ankle pumps ? ? ?[facility] Adult PT Treatment:                                                DATE: 06/07/21 ?Therapeutic Exercise: ?NuStep level 4 for 4 min LE only ?Resisted sidestepping RTB 3x length of table ?SLR left LE 2 x 10 ?Prone on elbows x2 min ?Sit to stands from standard height chair 1x10 with 10 lb dumbbell ?Supine quad stretch with strap 3x30 sec ?Neuromuscular re-ed: ?Prone PPT 10 x 5 sec holds ?PPT with marches 1 x 10  ?LE lifts in quadruped 12x10 ?Pallof press 2x15 GTB doubled up ? ?[facility] Adult PT Treatment:                                                DATE: 05/28/21 ?Therapeutic Exercise: ?NuStep level 4 for 4 min LE only ?Prone on elbow x2 min ?Prone press ups x2 min ?Bilateral clamshells 2x12 bil ?Right side lying hip ABD 1x10 ?Knee rockers in hook lying x10 each way ?SLR left LE 2 x 10 ?Sit to stands from standard height chair 1x10 ?Supine quad stretch with strap 4x20 sec ? ?Neuromuscular re-ed: ?Nerve glides x12 left LE ?Prone PPT 10 x 5 sec holds ?PPT with marches 1 x 10  ?LE lifts in quadruped 1x10 ?Pallof press 2x10 GTB ?  ? ?PATIENT EDUCATION:  ?Education details: Educated on findings in evaluation and HEP. Provided handout for HEP. Discussed and demonstrated proper lifting mechanics.  ?Person educated: Patient ?Education method: Explanation, Demonstration, Tactile cues, Verbal cues, and Handouts ?Education  comprehension: verbalized understanding ?  ?  ?HOME EXERCISE PROGRAM: ?Access Code: [email or web address] ?URL: [email or web address] ?Date: 06/12/2021 ?Prepared by: Starr Lake ? ?Exercises ?- Supine Quadriceps Stretch with Strap on Table  - 1 x daily - 7 x weekly - 3 sets - 30 seconds hold ?- Sidelying Hip Abduction  - 1 x daily - 7 x weekly - 2 sets - 10 reps ?- Prone Press Up On Elbows  - 1 x  daily - 7 x weekly - 3 sets - 10 seconds hold ?- Prone Press Up  - 1 x daily - 7 x weekly - 3 sets - 10 seconds hold ?- Supine Straight Leg Raises  - 1 x daily - 7 x weekly - 2 sets - 10 reps ?- Clamshell  - 1 x daily - 7 x weekly - 2 sets - 12 reps ?- Supine Posterior Pelvic Tilt  - 1 x daily - 7 x weekly - 2 sets - 10 reps ?- Side Stepping with Resistance at Ankles  - 1 x daily - 7 x weekly ?- Seated Hamstring Stretch  - 1 x daily - 7 x weekly - 2 sets - 2 reps - 30 hold ?- Supine Sciatic Nerve Glide  - 1 x daily - 7 x weekly - 2 sets - 15 reps ?- Isometric Dead Bug  - 1 x daily - 7 x weekly - 2 sets - 10 reps - 10 seconds hold ? ? ? ? ?ASSESSMENT: ?  ?CLINICAL IMPRESSION:  ?Theodus reports consistency with his HEP and notes he is doing better but does continue to get the LLE referred symptoms. Further assessment revealed potential posterior SIJ involvement. Worked on hamstring stretching and L hip flexor strengthening/ MET. He did well with all exercises and updated his HEP today. End of session he reported no pain and noted improvement in his walking. Plan to review lifting mechanics next session.  ?  ?OBJECTIVE IMPAIRMENTS Abnormal gait, decreased activity tolerance, difficulty walking, decreased strength, improper body mechanics, and pain.  ?  ?ACTIVITY LIMITATIONS community activity, yard work, and work tasks .  ?  ?PERSONAL FACTORS 1-2 comorbidities: HTN and chronic kidney disease  are also affecting patient's functional outcome.  ?  ?  ?REHAB POTENTIAL: Good ?  ?CLINICAL DECISION MAKING: Stable/uncomplicated ?  ?EVALUATION COMPLEXITY: Low ?  ?  ?GOALS: ?Goals reviewed with patient? No ?  ?SHORT TERM GOALS: ?  ?Patient will be independent with HEP for PT progression.  ?Baseline: initial HEP provided ?Target date: 06/07/2021 ?Goal status: MET 06/12/2021 ?  ?  ?LONG TERM GOALS: ?  ?Patient will score a 73 on his FOTO.  ?Baseline: 51 ?Target date: 06/28/2021 ?Goal status: INITIAL ?  ?2.  Patient will demonstrate  awareness and improved proper body mechanics when lifting 5 lbs.  ?Baseline: stands farther from weight and reaches froward beyond BOS ?Target date: 06/28/2021 ?Goal status: INITIAL ?  ?3.  Patient wil

## 2021-06-18 ENCOUNTER — Ambulatory Visit: Payer: Federal, State, Local not specified - PPO | Admitting: Family Medicine

## 2021-06-18 VITALS — BP 150/82 | HR 76 | Temp 98.7°F | Ht 69.5 in | Wt 269.0 lb

## 2021-06-18 DIAGNOSIS — M5416 Radiculopathy, lumbar region: Secondary | ICD-10-CM

## 2021-06-18 NOTE — Progress Notes (Signed)
? ? ?Sterling Mondo T. Haig Gerardo, MD, CAQ Sports Medicine ?Nature conservation officer at [facility] ?[address] [place] ?[place], [address] ? ?Phone: [phone]  FAX: [phone] ? ?Rector Devonshire Tyson - 56 y.o. male  MRN [id number]  Date of Birth: Oct 14, 1965 ? ?Date: 06/18/2021  PCP: Hannah Beat, MD  Referral: Hannah Beat, MD ? ?Chief Complaint  ?Patient presents with  ? Follow-up  ?  Left Lumbar Radiculopathy  ? ? ?This visit occurred during the SARS-CoV-2 public health emergency.  Safety protocols were in place, including screening questions prior to the visit, additional usage of staff PPE, and extensive cleaning of exam room while observing appropriate contact time as indicated for disinfecting solutions.  ? ?Subjective:  ? ?Andrew Stout is a 56 y.o. very pleasant male patient with Body mass index is 39.15 kg/m?. who presents with the following: ? ?F/u lumbar radiculopathy:   Initially when I saw the patient, he was having relatively severe pain with left-sided lumbar radiculopathy.  Clinically, consistent with disc herniation.  I did place the patient on some steroids, as well as some other supportive measures.  He has undergone physical therapy and since February he has done much better. ? ?He rates his pain now is 85 to 90% better. ? ?His movement is a lot better, but he still is having to take some extra breaks at work. ? ? ?05/16/2021 Last OV with Hannah Beat, MD  ?I last saw him on 04/30/2021. ?  ?Numbness is getting better, but with some radicular pain down the L side.  Will go off and on, when on his feet will start with the L side, and rad.  It is still very painful and limiting his ability to bend and pick things up as well as in his rotational capacity.  The radicular pain is the primary issue right now. ?  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  He does try to stretch and move  about, but he often is having trouble walking.  At the end of the day he is having some very significant pain. ?  ?He is also having a great deal of pain sleeping.  He is not sleeping much at all. ?  ?Initially, did give him 10 days worth of steroids, and this did help with his symptoms, but not completely, and they did return. ? ? ?Review of Systems is noted in the HPI, as appropriate ? ?Objective:  ? ?BP (!) 150/82   Pulse 76   Temp 98.7 ?F (37.1 ?C) (Oral)   Ht 5' 9.5" (1.765 m)   Wt 269 lb (122 kg)   SpO2 98%   BMI 39.15 kg/m?  ? ?GEN: No acute distress; alert,appropriate. ?PULM: Breathing comfortably in no respiratory distress ?PSYCH: Normally interactive.  ? ?Full range of motion of the spine.  Grossly nontender throughout the spinous processes and paraspinous musculature. ?GTB nontender ?Neurovascular intact throughout the entirety of the lower extremity.  Strength is normal. ? ?Laboratory and Imaging Data: ? ?Assessment and Plan:  ? ?  ICD-10-CM   ?1. Left lumbar radiculopathy  M54.16   ?  ? ?Resolving left-sided lumbar radiculopathy.  Patient seems to be doing much better, and I am going to release him to follow-up as needed only. ? ?Continue physical therapy and home rehab. ? ?Dragon Medical One speech-to-text software was used for transcription in this dictation.  Possible transcriptional  errors can occur using Animal nutritionist.  ? ?Signed, ? ?Shiori Adcox T. Axell Trigueros, MD ? ? ?Outpatient Encounter Medications as of 06/18/2021  ?Medication Sig  ? cyclobenzaprine (FLEXERIL) 10 MG tablet Take 1 tablet (10 mg total) by mouth at bedtime as needed for muscle spasms.  ? fluticasone (FLONASE) 50 MCG/ACT nasal spray Place 2 sprays into both nostrils daily as needed for allergies or rhinitis.  ? losartan (COZAAR) 100 MG tablet TAKE 1 TABLET(100 MG) BY MOUTH DAILY  ? Multiple Vitamin (MULTIVITAMIN) tablet Take 1 tablet by mouth daily.  ? [DISCONTINUED] predniSONE (DELTASONE) 20 MG tablet 2 tabs po for 7 days, then 1  tab po for 7 days  ? ?No facility-administered encounter medications on file as of 06/18/2021.  ?  ?

## 2021-06-19 ENCOUNTER — Ambulatory Visit: Payer: Federal, State, Local not specified - PPO | Attending: Family Medicine | Admitting: Physical Therapy

## 2021-06-19 ENCOUNTER — Other Ambulatory Visit: Payer: Self-pay | Admitting: Family Medicine

## 2021-06-19 ENCOUNTER — Encounter: Payer: Self-pay | Admitting: Physical Therapy

## 2021-06-19 ENCOUNTER — Encounter: Payer: Self-pay | Admitting: Family Medicine

## 2021-06-19 DIAGNOSIS — M6281 Muscle weakness (generalized): Secondary | ICD-10-CM | POA: Insufficient documentation

## 2021-06-19 DIAGNOSIS — R2689 Other abnormalities of gait and mobility: Secondary | ICD-10-CM | POA: Insufficient documentation

## 2021-06-19 DIAGNOSIS — M5416 Radiculopathy, lumbar region: Secondary | ICD-10-CM | POA: Insufficient documentation

## 2021-06-19 NOTE — Therapy (Signed)
?OUTPATIENT PHYSICAL THERAPY TREATMENT NOTE ? ? ?Patient Name: Andrew Stout ?MRN: [id number] ?DOB:1965/05/11, 56 y.o., male ?Today's Date: 06/19/2021 ? ?PCP: Owens Loffler, MD ?REFERRING PROVIDER: Owens Loffler, MD ? ? PT End of Session - 06/19/21 1504   ? ? Visit Number 5   ? Number of Visits 10   ? Date for PT Re-Evaluation 06/27/21   ? PT Start Time 1505   ? PT Stop Time 3976   ? PT Time Calculation (min) 40 min   ? Activity Tolerance Patient tolerated treatment well   ? Behavior During Therapy [facility] for tasks assessed/performed   ? ?  ?  ? ?  ? ? ? ? ? ?Past Medical History:  ?Diagnosis Date  ? Allergy   ? seasonal  ? Chronic kidney disease   ? one kidney  ? Hypertension   ? Solitary kidney, congenital 05/10/2015  ? ?Past Surgical History:  ?Procedure Laterality Date  ? CHEST TUBE INSERTION    ? collasped lung in high school  ? NEPHROSTOMY  1975  ? left  ? TYMPANOSTOMY TUBE PLACEMENT  2010  ? right  ? VASECTOMY    ? ?Patient Active Problem List  ? Diagnosis Date Noted  ? Solitary kidney, congenital 05/10/2015  ? Right carpal tunnel syndrome 01/01/2013  ? Essential hypertension 03/07/2009  ? ? ?REFERRING DIAG:  ?M51.26 (ICD-10-CM) - Herniated nucleus pulposus, lumbar  ?M54.16 (ICD-10-CM) - Left lumbar radiculopathy  ? ? ?THERAPY DIAG:  ?Radiculopathy, lumbar region ? ?Muscle weakness (generalized) ? ?Other abnormalities of gait and mobility ? ?PERTINENT HISTORY: Patient went to the ED 04/25/21 with episodes of muscle spasms in his back and left thigh. Patient enjoys working out prior to this.  ? ?PRECAUTIONS: none ? ?SUBJECTIVE: "no back pain with some occasional soreness in the  ? ?PAIN:  ?Are you having pain? No ? ? ? ?OBJECTIVE:  ?  ?DIAGNOSTIC FINDINGS:  ?Lumbar X-ray: negative ?  ?PATIENT SURVEYS:  ?FOTO 51, predicted: 51 ?  ?SCREENING FOR RED FLAGS: ?Bowel or bladder incontinence: No ?Cauda equina syndrome: No ?  ?COGNITION: ?         Overall cognitive status: Within functional limits for tasks  assessed              ?          ?SENSATION: ?         Light touch: Appears intact ?  ?  ?POSTURE:  ?Shifts weight over onto right LE ?  ?PALPATION: ?No tenderness to palpation ?  ?LUMBARAROM/PROM ?  ?A/PROM A/PROM  ?05/24/2021  ?Flexion Toe touch  ?Extension 20  ?Right lateral flexion 25  ?Left lateral flexion 25  ?Right rotation    ?Left rotation    ? (Blank rows = not tested) ?  ?LE AROM/PROM: ?  ?A/PROM Right ?05/24/2021 Left ?05/24/2021  ?Hip flexion 100 100  ?Hip extension      ?Hip abduction      ?Hip adduction      ?Hip internal rotation      ?Hip external rotation      ?Knee flexion      ?Knee extension      ?Ankle dorsiflexion      ?Ankle plantarflexion      ?Ankle inversion      ?Ankle eversion      ? (Blank rows = not tested) ?  ?LE MMT: ?  ?MMT Right ?05/24/2021 Left ?05/24/2021  ?Hip flexion 4+ 3+  ?Hip extension 4- 4-  ?  Hip abduction 4 4-  ?Hip adduction      ?Hip internal rotation 4+ 4+  ?Hip external rotation 4- 4  ?Knee flexion 5 5  ?Knee extension 5 4+  ?Ankle dorsiflexion 5 5  ?Ankle plantarflexion      ?Ankle inversion      ?Ankle eversion      ? (Blank rows = not tested) ?  ?LUMBAR SPECIAL TESTS:  ?Straight leg raise test: Negative, Slump test: Negative, SI Compression/distraction test: Negative, and Thomas test: Positive ?Repeated lumbar flexion: - ?Repeated lumbar extension: reduced left LE radicular sx ?  ?FUNCTIONAL TESTS:  ?5 times sit to stand: 20 seconds ?Timed up and go (TUG): 14 seconds ?Demonstrated improper body mechanics with lifting and reaches for weight far beyond BOS ?  ?GAIT: ?Distance walked: 10 ft ?Comments: reduced stride length of left LE and reduced toe off ?  ?TODAY'S TREATMENT: ? ?[facility] Adult PT Treatment:                                                DATE: 06/19/2021 ?Therapeutic Exercise: ?Treadmill L 2 x 2 min, 3.o x 3 min (attempted 4.0 but was unable to maintain safely so reduced speed.) ?Standing quad stretch 3 x 30  ?Standing calf stretch  ?Heel raise with ball ,squeeze 2 x  20  ?Mini lunge 1 x 10 bil, tactile cues to prevent from lead leg going over toes.  ?Manual Therapy: ?IASTM along the patellar tendon ?MTPR along the tibialis posterior x 3 ?Self Care: ?How to perform IASTM to himself as well as MTPR ? ?[facility] Adult PT Treatment:                                                DATE: 06/12/2021 ?Therapeutic Exercise: ?PNF contract/ relax hamstring stretch 2 x 30 sec ?SLR LLE 2 x 12 ?Single knee to chest 2 x 30 sec LLE only ?Dead bug 10 x holding 10 sec, combined with sustained posterior pelvic ?LTR 1 x 20 ?Manual Therapy: ?MET of the L hip flexor activation utilizing scissor technique ?Sciatic nerve glides with hamstring stretch and ankle pumps ? ? ?[facility] Adult PT Treatment:                                                DATE: 06/07/21 ?Therapeutic Exercise: ?NuStep level 4 for 4 min LE only ?Resisted sidestepping RTB 3x length of table ?SLR left LE 2 x 10 ?Prone on elbows x2 min ?Sit to stands from standard height chair 1x10 with 10 lb dumbbell ?Supine quad stretch with strap 3x30 sec ?Neuromuscular re-ed: ?Prone PPT 10 x 5 sec holds ?PPT with marches 1 x 10  ?LE lifts in quadruped 12x10 ?Pallof press 2x15 GTB doubled up ? ?PATIENT EDUCATION:  ?Education details: Educated on findings in evaluation and HEP. Provided handout for HEP. Discussed and demonstrated proper lifting mechanics.  ?Person educated: Patient ?Education method: Explanation, Demonstration, Tactile cues, Verbal cues, and Handouts ?Education comprehension: verbalized understanding ?  ?  ?HOME EXERCISE PROGRAM: ?Access Code: [email or web address] ?URL: [email or web address] ?Date: 06/19/2021 ?Prepared by: Starr Lake ? ?  Exercises ?- Supine Quadriceps Stretch with Strap on Table  - 1 x daily - 7 x weekly - 3 sets - 30 seconds hold ?- Sidelying Hip Abduction  - 1 x daily - 7 x weekly - 2 sets - 10 reps ?- Prone Press Up On Elbows  - 1 x daily - 7 x weekly - 3 sets - 10 seconds hold ?- Prone Press Up  - 1 x daily - 7 x  weekly - 3 sets - 10 seconds hold ?- Supine Straight Leg Raises  - 1 x daily - 7 x weekly - 2 sets - 10 reps ?- Clamshell  - 1 x daily - 7 x weekly - 2 sets - 12 reps ?- Supine Posterior Pelvic Tilt  - 1 x daily - 7 x weekly - 2 sets - 10 reps ?- Side Stepping with Resistance at Ankles  - 1 x daily - 7 x weekly ?- Seated Hamstring Stretch  - 1 x daily - 7 x weekly - 2 sets - 2 reps - 30 hold ?- Supine Sciatic Nerve Glide  - 1 x daily - 7 x weekly - 2 sets - 15 reps ?- Isometric Dead Bug  - 1 x daily - 7 x weekly - 2 sets - 10 reps - 10 seconds hold ?- Standard Lunge  - 1 x daily - 7 x weekly - 2 sets - 10 reps ?- Standing Heel Raises  - 1 x daily - 7 x weekly - 2 sets - 20 reps ? ? ? ? ?ASSESSMENT: ?  ?CLINICAL IMPRESSION:  ?Pt arrives to session reporting his back was doing good with no pain and that he has only an intermittent twinge in the L knee. Time was taken to review how to perform IASTM techniques to himself as well as self trigger point release along the posterior tibialis. Trailed treadmill walking which he did well at 3.0 pace but was unable to maintain safety going more than 3.0. Continued working on knee and calf strengthening which he responded well to but noted some soreness in the knee.  ?  ?OBJECTIVE IMPAIRMENTS Abnormal gait, decreased activity tolerance, difficulty walking, decreased strength, improper body mechanics, and pain.  ?  ?ACTIVITY LIMITATIONS community activity, yard work, and work tasks .  ?  ?PERSONAL FACTORS 1-2 comorbidities: HTN and chronic kidney disease  are also affecting patient's functional outcome.  ?  ?  ?REHAB POTENTIAL: Good ?  ?CLINICAL DECISION MAKING: Stable/uncomplicated ?  ?EVALUATION COMPLEXITY: Low ?  ?  ?GOALS: ?Goals reviewed with patient? No ?  ?SHORT TERM GOALS: ?  ?Patient will be independent with HEP for PT progression.  ?Baseline: initial HEP provided ?Target date: 06/07/2021 ?Goal status: MET 06/12/2021 ?  ?  ?LONG TERM GOALS: ?  ?Patient will score a 73 on  his FOTO.  ?Baseline: 51 ?Target date: 06/28/2021 ?Goal status: INITIAL ?  ?2.  Patient will demonstrate awareness and improved proper body mechanics when lifting 5 lbs.  ?Baseline: stands farther from weight

## 2021-06-25 NOTE — Therapy (Signed)
?OUTPATIENT PHYSICAL THERAPY TREATMENT NOTE / DISCHARGE ? ?Patient Name: Andrew Stout ?MRN: [id number] ?DOB:05-14-1965, 56 y.o., male ?Today's Date: 06/26/2021 ? ?PCP: Owens Loffler, MD ?REFERRING PROVIDER: Owens Loffler, MD ? ? PT End of Session - 06/26/21 1506   ? ? Visit Number 6   ? Number of Visits 10   ? Date for PT Re-Evaluation 06/27/21   ? Authorization Type BCBS   ? Authorization Time Period tbd   ? PT Start Time 8416   ? PT Stop Time 1543   ? PT Time Calculation (min) 39 min   ? Activity Tolerance Patient tolerated treatment well   ? ?  ?  ? ?  ? ? ? ? ? ? ?Past Medical History:  ?Diagnosis Date  ? Allergy   ? seasonal  ? Chronic kidney disease   ? one kidney  ? Hypertension   ? Solitary kidney, congenital 05/10/2015  ? ?Past Surgical History:  ?Procedure Laterality Date  ? CHEST TUBE INSERTION    ? collasped lung in high school  ? NEPHROSTOMY  1975  ? left  ? TYMPANOSTOMY TUBE PLACEMENT  2010  ? right  ? VASECTOMY    ? ?Patient Active Problem List  ? Diagnosis Date Noted  ? Solitary kidney, congenital 05/10/2015  ? Right carpal tunnel syndrome 01/01/2013  ? Essential hypertension 03/07/2009  ? ? ?REFERRING DIAG:  ?M51.26 (ICD-10-CM) - Herniated nucleus pulposus, lumbar  ?M54.16 (ICD-10-CM) - Left lumbar radiculopathy  ? ? ?THERAPY DIAG:  ?Radiculopathy, lumbar region ? ?Muscle weakness (generalized) ? ?Other abnormalities of gait and mobility ? ?PERTINENT HISTORY: Patient went to the ED 04/25/21 with episodes of muscle spasms in his back and left thigh. Patient enjoys working out prior to this.  ? ?PRECAUTIONS: none ? ?SUBJECTIVE: "Today I am doing good, a little sore but not bad. I did my exercises and I noticed some soreness in the back which I backed off and it went away" ? ?PAIN:  ?Are you having pain? No ? ? ? ?OBJECTIVE:  ?  ?DIAGNOSTIC FINDINGS:  ?Lumbar X-ray: negative ?  ?PATIENT SURVEYS:  ?FOTO 51, predicted: 73 ?06/26/2021  FOTO 60  ?  ?SCREENING FOR RED FLAGS: ?Bowel or bladder  incontinence: No ?Cauda equina syndrome: No ?  ?COGNITION: ?         Overall cognitive status: Within functional limits for tasks assessed              ?          ?SENSATION: ?         Light touch: Appears intact ?  ?  ?POSTURE:  ?Shifts weight over onto right LE ?  ?PALPATION: ?No tenderness to palpation ?  ?LUMBARAROM/PROM ?  ?A/PROM A/PROM  ?05/24/2021  ?Flexion Toe touch  ?Extension 20  ?Right lateral flexion 25  ?Left lateral flexion 25  ?Right rotation    ?Left rotation    ? (Blank rows = not tested) ?  ?LE AROM/PROM: ?  ?A/PROM Right ?05/24/2021 Left ?05/24/2021  ?Hip flexion 100 100  ?Hip extension      ?Hip abduction      ?Hip adduction      ?Hip internal rotation      ?Hip external rotation      ?Knee flexion      ?Knee extension      ?Ankle dorsiflexion      ?Ankle plantarflexion      ?Ankle inversion      ?Ankle eversion      ? (  Blank rows = not tested) ?  ?LE MMT: ?  ?MMT Right ?05/24/2021 Left ?05/24/2021 Left ?06/26/2021  ?Hip flexion 4+ 3+ 4/5  ?Hip extension 4- 4- 4/5  ?Hip abduction 4 4- 4/5  ?Hip adduction       ?Hip internal rotation 4+ 4+   ?Hip external rotation 4- 4   ?Knee flexion 5 5   ?Knee extension 5 4+   ?Ankle dorsiflexion 5 5   ?Ankle plantarflexion       ?Ankle inversion       ?Ankle eversion       ? (Blank rows = not tested) ?  ?LUMBAR SPECIAL TESTS:  ?Straight leg raise test: Negative, Slump test: Negative, SI Compression/distraction test: Negative, and Thomas test: Positive ?Repeated lumbar flexion: - ?Repeated lumbar extension: reduced left LE radicular sx ?  ?FUNCTIONAL TESTS:  ?5 times sit to stand: 20 seconds ?Timed up and go (TUG): 14 seconds ?Demonstrated improper body mechanics with lifting and reaches for weight far beyond BOS ?  ?GAIT: ?Distance walked: 10 ft ?Comments: reduced stride length of left LE and reduced toe off ?  ?TODAY'S TREATMENT: ? ? ?[facility]:                                                DATE: 06/25/2021 ?Therapeutic Exercise: ?LAQ 3 x 12 with ball  squeeze ?Dead lift 1 x 10 10# kettlebell ? ?  Reviewed and updated HEP PRN ?Manual Therapy: ?MTPR along the vastus lateralis x 2 and how  ?DTM along the vastus lateralis ? ? ? ?[facility] Adult PT Treatment:                                                DATE: 06/19/2021 ?Therapeutic Exercise: ?Treadmill L 2 x 2 min, 3.o x 3 min (attempted 4.0 but was unable to maintain safely so reduced speed.) ?Standing quad stretch 3 x 30  ?Standing calf stretch  ?Heel raise with ball ,squeeze 2 x 20  ?Mini lunge 1 x 10 bil, tactile cues to prevent from lead leg going over toes.  ?Manual Therapy: ?IASTM along the patellar tendon ?MTPR along the tibialis posterior x 3 ?Self Care: ?How to perform IASTM to himself as well as MTPR ? ?[facility] Adult PT Treatment:                                                DATE: 06/12/2021 ?Therapeutic Exercise: ?PNF contract/ relax hamstring stretch 2 x 30 sec ?SLR LLE 2 x 12 ?Single knee to chest 2 x 30 sec LLE only ?Dead bug 10 x holding 10 sec, combined with sustained posterior pelvic ?LTR 1 x 20 ?Manual Therapy: ?MET of the L hip flexor activation utilizing scissor technique ?Sciatic nerve glides with hamstring stretch and ankle pumps ? ? ?PATIENT EDUCATION:  ?Education details: Educated on findings in evaluation and HEP. Provided handout for HEP. Discussed and demonstrated proper lifting mechanics.  ?Person educated: Patient ?Education method: Explanation, Demonstration, Tactile cues, Verbal cues, and Handouts ?Education comprehension: verbalized understanding ?  ?  ?HOME EXERCISE PROGRAM: ?  Access Code: [email or web address] ?URL: [email or web address] ?Date: 06/19/2021 ?Prepared by: Starr Lake ? ?Exercises ?- Supine Quadriceps Stretch with Strap on Table  - 1 x daily - 7 x weekly - 3 sets - 30 seconds hold ?- Sidelying Hip Abduction  - 1 x daily - 7 x weekly - 2 sets - 10 reps ?- Prone Press Up On Elbows  - 1 x daily - 7 x weekly - 3 sets - 10 seconds hold ?- Prone Press Up  - 1 x daily - 7 x  weekly - 3 sets - 10 seconds hold ?- Supine Straight Leg Raises  - 1 x daily - 7 x weekly - 2 sets - 10 reps ?- Clamshell  - 1 x daily - 7 x weekly - 2 sets - 12 reps ?- Supine Posterior Pelvic Tilt  - 1 x daily - 7 x weekly - 2 sets - 10 reps ?- Side Stepping with Resistance at Ankles  - 1 x daily - 7 x weekly ?- Seated Hamstring Stretch  - 1 x daily - 7 x weekly - 2 sets - 2 reps - 30 hold ?- Supine Sciatic Nerve Glide  - 1 x daily - 7 x weekly - 2 sets - 15 reps ?- Isometric Dead Bug  - 1 x daily - 7 x weekly - 2 sets - 10 reps - 10 seconds hold ?- Standard Lunge  - 1 x daily - 7 x weekly - 2 sets - 10 reps ?- Standing Heel Raises  - 1 x daily - 7 x weekly - 2 sets - 20 reps ? ? ? ? ?ASSESSMENT: ?  ?CLINICAL IMPRESSION:  ?Pt has made excellent progress with physical therapy and additionally reports min to no pain in the knee/ back. He has met or partially met all goals except for LTG #1. Reviewed STW techniques to reduce tension in the quad to assist with patellofemoral biomechanics. He performed exercises prescribed but did fatigue quickly. He is able to maintain and progress his current LOF IND and will be discharged from PT today.  ?  ?OBJECTIVE IMPAIRMENTS Abnormal gait, decreased activity tolerance, difficulty walking, decreased strength, improper body mechanics, and pain.  ?  ?ACTIVITY LIMITATIONS community activity, yard work, and work tasks .  ?  ?PERSONAL FACTORS 1-2 comorbidities: HTN and chronic kidney disease  are also affecting patient's functional outcome.  ?  ?  ?REHAB POTENTIAL: Good ?  ?CLINICAL DECISION MAKING: Stable/uncomplicated ?  ?EVALUATION COMPLEXITY: Low ?  ?  ?GOALS: ?Goals reviewed with patient? No ?  ?SHORT TERM GOALS: ?  ?Patient will be independent with HEP for PT progression.  ?Baseline: initial HEP provided ?Target date: 06/07/2021 ?Goal status: MET 06/12/2021 ?  ?  ?LONG TERM GOALS: ?  ?Patient will score a 73 on his FOTO.  ?Baseline: 51 ?Target date: 06/28/2021 ?Goal status: Not  MET ?  ?2.  Patient will demonstrate awareness and improved proper body mechanics when lifting 5 lbs.  ?Baseline: stands farther from weight and reaches froward beyond BOS ?Target date: 06/28/2021 ?Goal status: MET

## 2021-06-26 ENCOUNTER — Ambulatory Visit: Payer: Federal, State, Local not specified - PPO | Admitting: Physical Therapy

## 2021-06-26 ENCOUNTER — Encounter: Payer: Self-pay | Admitting: Physical Therapy

## 2021-06-26 DIAGNOSIS — M6281 Muscle weakness (generalized): Secondary | ICD-10-CM | POA: Diagnosis not present

## 2021-06-26 DIAGNOSIS — M5416 Radiculopathy, lumbar region: Secondary | ICD-10-CM

## 2021-06-26 DIAGNOSIS — R2689 Other abnormalities of gait and mobility: Secondary | ICD-10-CM

## 2021-09-08 ENCOUNTER — Encounter: Payer: Self-pay | Admitting: Emergency Medicine

## 2021-09-08 ENCOUNTER — Ambulatory Visit
Admission: EM | Admit: 2021-09-08 | Discharge: 2021-09-08 | Disposition: A | Discharge status: Home / Self Care | Payer: Federal, State, Local not specified - PPO | Attending: Family Medicine | Admitting: Family Medicine

## 2021-09-08 DIAGNOSIS — R059 Cough, unspecified: Secondary | ICD-10-CM | POA: Diagnosis not present

## 2021-09-08 DIAGNOSIS — J019 Acute sinusitis, unspecified: Secondary | ICD-10-CM

## 2021-09-08 MED ORDER — PROMETHAZINE-DM 6.25-15 MG/5ML PO SYRP: 5.0000 mL | ORAL_SOLUTION | Freq: Three times a day (TID) | ORAL | 0 refills | Status: DC | PRN

## 2021-09-08 MED ORDER — AMOXICILLIN 875 MG PO TABS: 875.0000 mg | ORAL_TABLET | Freq: Two times a day (BID) | ORAL | 0 refills | Status: AC

## 2021-09-08 NOTE — ED Triage Notes (Signed)
Pt presents with diarrhea off and on x 1 week. He also has a cough, runny nose, sneezing and drainage x 1 week. His symptoms has gotten worse the past few days.

## 2021-09-24 ENCOUNTER — Telehealth: Payer: Self-pay | Admitting: Family Medicine

## 2021-09-25 NOTE — Telephone Encounter (Signed)
Please schedule CPE with fasting labs prior for Dr. Copland.  

## 2021-09-25 NOTE — Telephone Encounter (Signed)
Pt scheduled for CPE on 10/10/21 with labs prior

## 2021-09-25 NOTE — Telephone Encounter (Signed)
Called and LVM for patient to callback to schedule CPE and labs.

## 2021-10-01 ENCOUNTER — Ambulatory Visit: Payer: Federal, State, Local not specified - PPO | Admitting: Family Medicine

## 2021-10-01 ENCOUNTER — Other Ambulatory Visit: Payer: Self-pay | Admitting: Family Medicine

## 2021-10-01 ENCOUNTER — Encounter: Payer: Self-pay | Admitting: Family Medicine

## 2021-10-01 VITALS — BP 140/88 | HR 70 | Temp 98.0°F | Ht 69.5 in | Wt 271.2 lb

## 2021-10-01 DIAGNOSIS — R051 Acute cough: Secondary | ICD-10-CM

## 2021-10-01 DIAGNOSIS — Z125 Encounter for screening for malignant neoplasm of prostate: Secondary | ICD-10-CM

## 2021-10-01 DIAGNOSIS — Z114 Encounter for screening for human immunodeficiency virus [HIV]: Secondary | ICD-10-CM

## 2021-10-01 DIAGNOSIS — E785 Hyperlipidemia, unspecified: Secondary | ICD-10-CM

## 2021-10-01 DIAGNOSIS — Z1159 Encounter for screening for other viral diseases: Secondary | ICD-10-CM

## 2021-10-01 DIAGNOSIS — Z131 Encounter for screening for diabetes mellitus: Secondary | ICD-10-CM

## 2021-10-01 DIAGNOSIS — J0101 Acute recurrent maxillary sinusitis: Secondary | ICD-10-CM

## 2021-10-01 DIAGNOSIS — Z79899 Other long term (current) drug therapy: Secondary | ICD-10-CM

## 2021-10-01 MED ORDER — AMOXICILLIN-POT CLAVULANATE 875-125 MG PO TABS: 1.0000 | ORAL_TABLET | Freq: Two times a day (BID) | ORAL | 0 refills | Status: DC

## 2021-10-01 NOTE — Progress Notes (Signed)
Nandan Willems T. Annamae Shivley, MD, CAQ Sports Medicine [facility] at [facility] [address] [place], [address]  Phone: [phone]  FAX: [phone]  Andrew Stout - 56 y.o. male  MRN [id number]  Date of Birth: [birth year]  Date: 10/01/2021  PCP: Hannah Beat, MD  Referral: Hannah Beat, MD  Chief Complaint  Patient presents with   Follow-up    Urgent Care 09/08/21-Sinusitis/Cough-Completed all meds but symptoms are starting to come back   Subjective:   Andrew Stout is a 56 y.o. very pleasant male patient with Body mass index is 39.48 kg/m. who presents with the following:  He presents for some follow-up after recent urgent care visit where he had and was felt to have acute sinusitis.  At that point he was given some Promethazine DM cough syrup as well as 875 mg of amoxicillin.  He presents today in follow-up for this acute illness.  Cough is still ongoing, off and on, mucous in the throat. Wife has PNA. He generally feels bad and he has pain in his frontal face.  Some tooth pain and some sore throat.  No significant earache. He does have still a very significant, productive cough.  At the time of his illness, he did not check for COVID. He is eating and drinking okay without nausea, vomiting, diarrhea.   Review of Systems is noted in the HPI, as appropriate  Objective:   BP 140/88   Pulse 70   Temp 98 F (36.7 C) (Oral)   Ht 5' 9.5" (1.765 m)   Wt 271 lb 4 oz (123 kg)   SpO2 98%   BMI 39.48 kg/m    Gen: WDWN, NAD; alert,appropriate and cooperative throughout exam  HEENT: Normocephalic and atraumatic. Throat clear, w/o exudate, no LAD, R TM clear, L TM - good landmarks, No fluid present. rhinnorhea.  Left frontal and maxillary sinuses: Tender Right frontal and maxillary sinuses: Tender  Neck: No ant or post LAD CV: RRR, No M/G/R Pulm: Breathing comfortably in no resp distress. no w/c/r Psych: full affect, pleasant    Laboratory and Imaging Data:  Assessment and Plan:     ICD-10-CM   1. Acute recurrent maxillary sinusitis  J01.01     2. Acute cough  R05.1      Treatment failure thus far.  3 weeks of symptoms.  Did fail 1 course of amoxicillin, so I am going to place the patient on Augmentin for 10 days. Otherwise continue with supportive care.  Medication Management during today's office visit: Meds ordered this encounter  Medications   amoxicillin-clavulanate (AUGMENTIN) 875-125 MG tablet    Sig: Take 1 tablet by mouth 2 (two) times daily.    Dispense:  20 tablet    Refill:  0   There are no discontinued medications.  Orders placed today for conditions managed today: No orders of the defined types were placed in this encounter.   Follow-up if needed: No follow-ups on file.  Dragon [employer] One speech-to-text software was used for transcription in this dictation.  Possible transcriptional errors can occur using Animal nutritionist.   Signed,  Elpidio Galea. Kahli Mayon, MD   Outpatient Encounter Medications as of 10/01/2021  Medication Sig   amoxicillin-clavulanate (AUGMENTIN) 875-125 MG tablet Take 1 tablet by mouth 2 (two) times daily.   cyclobenzaprine (FLEXERIL) 10 MG tablet Take 1 tablet (10 mg total) by mouth at bedtime as needed for muscle spasms.   fluticasone (FLONASE) 50 MCG/ACT nasal spray Place 2  sprays into both nostrils daily as needed for allergies or rhinitis.   losartan (COZAAR) 100 MG tablet TAKE 1 TABLET(100 MG) BY MOUTH DAILY   Multiple Vitamin (MULTIVITAMIN) tablet Take 1 tablet by mouth daily.   promethazine-dextromethorphan (PROMETHAZINE-DM) 6.25-15 MG/5ML syrup Take 5 mLs by mouth 3 (three) times daily as needed for cough.   No facility-administered encounter medications on file as of 10/01/2021.

## 2021-10-08 ENCOUNTER — Other Ambulatory Visit (INDEPENDENT_AMBULATORY_CARE_PROVIDER_SITE_OTHER): Payer: Federal, State, Local not specified - PPO

## 2021-10-08 DIAGNOSIS — Z1159 Encounter for screening for other viral diseases: Secondary | ICD-10-CM

## 2021-10-08 DIAGNOSIS — Z125 Encounter for screening for malignant neoplasm of prostate: Secondary | ICD-10-CM

## 2021-10-08 DIAGNOSIS — Z114 Encounter for screening for human immunodeficiency virus [HIV]: Secondary | ICD-10-CM | POA: Diagnosis not present

## 2021-10-08 DIAGNOSIS — E785 Hyperlipidemia, unspecified: Secondary | ICD-10-CM

## 2021-10-08 DIAGNOSIS — Z131 Encounter for screening for diabetes mellitus: Secondary | ICD-10-CM | POA: Diagnosis not present

## 2021-10-08 DIAGNOSIS — Z79899 Other long term (current) drug therapy: Secondary | ICD-10-CM

## 2021-10-08 LAB — CBC WITH DIFFERENTIAL/PLATELET
Basophils Absolute: 0 10*3/uL (ref 0.0–0.1)
Basophils Relative: 0.8 % (ref 0.0–3.0)
Eosinophils Absolute: 0.2 10*3/uL (ref 0.0–0.7)
Eosinophils Relative: 2.8 % (ref 0.0–5.0)
HCT: 44.8 % (ref 39.0–52.0)
Hemoglobin: 14.9 g/dL (ref 13.0–17.0)
Lymphocytes Relative: 23.2 % (ref 12.0–46.0)
Lymphs Abs: 1.3 10*3/uL (ref 0.7–4.0)
MCHC: 33.2 g/dL (ref 30.0–36.0)
MCV: 78.6 fl (ref 78.0–100.0)
Monocytes Absolute: 0.5 10*3/uL (ref 0.1–1.0)
Monocytes Relative: 8.3 % (ref 3.0–12.0)
Neutro Abs: 3.6 10*3/uL (ref 1.4–7.7)
Neutrophils Relative %: 64.9 % (ref 43.0–77.0)
Platelets: 250 10*3/uL (ref 150.0–400.0)
RBC: 5.7 Mil/uL (ref 4.22–5.81)
RDW: 14.3 % (ref 11.5–15.5)
WBC: 5.6 10*3/uL (ref 4.0–10.5)

## 2021-10-08 LAB — BASIC METABOLIC PANEL
BUN: 14 mg/dL (ref 6–23)
CO2: 27 mEq/L (ref 19–32)
Calcium: 8.9 mg/dL (ref 8.4–10.5)
Chloride: 104 mEq/L (ref 96–112)
Creatinine, Ser: 1.33 mg/dL (ref 0.40–1.50)
GFR: 60.24 mL/min (ref >60.00)
Glucose, Bld: 107 mg/dL — ABNORMAL HIGH (ref 70–99)
Potassium: 3.9 mEq/L (ref 3.5–5.1)
Sodium: 139 mEq/L (ref 135–145)

## 2021-10-08 LAB — LIPID PANEL
Cholesterol: 167 mg/dL (ref 0–200)
HDL: 41 mg/dL (ref >39.00)
LDL Cholesterol: 111 mg/dL — ABNORMAL HIGH (ref 0–99)
NonHDL: 125.52
Total CHOL/HDL Ratio: 4
Triglycerides: 73 mg/dL (ref 0.0–149.0)
VLDL: 14.6 mg/dL (ref 0.0–40.0)

## 2021-10-08 LAB — HEPATIC FUNCTION PANEL
ALT: 25 U/L (ref 0–53)
AST: 39 U/L — ABNORMAL HIGH (ref 0–37)
Albumin: 4.2 g/dL (ref 3.5–5.2)
Alkaline Phosphatase: 63 U/L (ref 39–117)
Bilirubin, Direct: 0.2 mg/dL (ref 0.0–0.3)
Total Bilirubin: 0.6 mg/dL (ref 0.2–1.2)
Total Protein: 6.9 g/dL (ref 6.0–8.3)

## 2021-10-08 LAB — HEMOGLOBIN A1C: Hgb A1c MFr Bld: 6.1 % (ref 4.6–6.5)

## 2021-10-10 ENCOUNTER — Encounter: Payer: Self-pay | Admitting: Family Medicine

## 2021-10-10 ENCOUNTER — Ambulatory Visit (INDEPENDENT_AMBULATORY_CARE_PROVIDER_SITE_OTHER): Payer: Federal, State, Local not specified - PPO | Admitting: Family Medicine

## 2021-10-10 VITALS — BP 120/80 | HR 81 | Temp 98.9°F | Ht 69.0 in | Wt 272.0 lb

## 2021-10-10 DIAGNOSIS — Z0000 Encounter for general adult medical examination without abnormal findings: Secondary | ICD-10-CM

## 2021-10-10 LAB — HEPATITIS C ANTIBODY: Hepatitis C Ab: NONREACTIVE

## 2021-10-10 LAB — PSA, TOTAL WITH REFLEX TO PSA, FREE: PSA, Total: 0.1 ng/mL (ref <=4.0)

## 2021-10-10 LAB — HIV ANTIBODY (ROUTINE TESTING W REFLEX): HIV 1&2 Ab, 4th Generation: NONREACTIVE

## 2021-10-10 NOTE — Progress Notes (Signed)
Andrew Stout T. Andrew Mcinroy, MD, CAQ Sports Medicine [facility] at [facility] [address] [place], [address]  Phone: [phone]  FAX: [phone]  Andrew Stout - 56 y.o. male  MRN [id number]  Date of Birth: Aug 01, 1965  Date: 10/10/2021  PCP: Hannah Beat, MD  Referral: Hannah Beat, MD  Chief Complaint  Patient presents with   Annual Exam   Patient Care Team: Hannah Beat, MD as PCP - General Subjective:   Andrew Stout is a 56 y.o. pleasant patient who presents with the following:  Preventative Health Maintenance Visit:  Health Maintenance Summary Reviewed and updated, unless pt declines services.  Tobacco History Reviewed. Alcohol: No concerns, no excessive use Exercise Habits: Some activity, rec at least 30 mins 5 times a week STD concerns: no risk or activity to increase risk Drug Use: None  Covid booster  Nose and cough are clearing up.  Leg is feeling better.   HTN: Tolerating all medications without side effects Stable and at goal No CP, no sob. No HA.  BP Readings from Last 3 Encounters:  10/10/21 120/80  10/01/21 140/88  09/08/21 (!) 146/91    Basic Metabolic Panel:    Component Value Date/Time   NA 139 10/08/2021 0859   K 3.9 10/08/2021 0859   CL 104 10/08/2021 0859   CO2 27 10/08/2021 0859   BUN 14 10/08/2021 0859   CREATININE 1.33 10/08/2021 0859   GLUCOSE 107 (H) 10/08/2021 0859   CALCIUM 8.9 10/08/2021 0859     Health Maintenance  Topic Date Due   COVID-19 Vaccine (4 - Booster for Moderna series) 04/17/2020   INFLUENZA VACCINE  10/16/2021   TETANUS/TDAP  05/09/2025   COLONOSCOPY (Pts 45-9yrs Insurance coverage will need to be confirmed)  01/03/2028   Hepatitis C Screening  Completed   HIV Screening  Completed   Zoster Vaccines- Shingrix  Completed   HPV VACCINES  Aged Out   Immunization History  Administered Date(s) Administered   Influenza Inj Mdck Quad Pf 12/25/2017    Influenza Split 12/10/2010, 01/06/2012   Influenza,inj,Quad PF,6+ Mos 01/13/2019   Influenza-Unspecified 12/17/2014, 02/14/2017   Moderna Sars-Covid-2 Vaccination 05/26/2019, 06/23/2019, 02/21/2020   Td 03/18/2005   Tdap 05/10/2015   Zoster Recombinat (Shingrix) 01/13/2019, 03/22/2019   Patient Active Problem List   Diagnosis Date Noted   Solitary kidney, congenital 05/10/2015   Right carpal tunnel syndrome 01/01/2013   Essential hypertension 03/07/2009    Past Medical History:  Diagnosis Date   Allergy    seasonal   Chronic kidney disease    one kidney   Hypertension    Solitary kidney, congenital 05/10/2015    Past Surgical History:  Procedure Laterality Date   CHEST TUBE INSERTION     collasped lung in high school   NEPHROSTOMY  1975   left   TYMPANOSTOMY TUBE PLACEMENT  2010   right   VASECTOMY      Family History  Problem Relation Age of Onset   Transient ischemic attack Father     Social History   Social History Narrative   Regular exercise-no    Past Medical History, Surgical History, Social History, Family History, Problem List, Medications, and Allergies have been reviewed and updated if relevant.  Review of Systems: Pertinent positives are listed above.  Otherwise, a full 14 point review of systems has been done in full and it is negative except where it is noted positive.  Objective:   BP 120/80   Pulse  81   Temp 98.9 F (37.2 C) (Oral)   Ht 5\' 9"  (1.753 m)   Wt 272 lb (123.4 kg)   SpO2 96%   BMI 40.17 kg/m  Ideal Body Weight: Weight in (lb) to have BMI = 25: 168.9  Ideal Body Weight: Weight in (lb) to have BMI = 25: 168.9 No results found.    10/10/2021    2:12 PM 06/14/2020   11:30 AM 01/13/2019    2:36 PM 11/03/2017    9:26 AM  Depression screen PHQ 2/9  Decreased Interest 0 0 0 0  Down, Depressed, Hopeless 0 0 0 0  PHQ - 2 Score 0 0 0 0     GEN: well developed, well nourished, no acute distress Eyes: conjunctiva and lids  normal, PERRLA, EOMI ENT: TM clear, nares clear, oral exam WNL Neck: supple, no lymphadenopathy, no thyromegaly, no JVD Pulm: clear to auscultation and percussion, respiratory effort normal CV: regular rate and rhythm, S1-S2, no murmur, rub or gallop, no bruits, peripheral pulses normal and symmetric, no cyanosis, clubbing, edema or varicosities GI: soft, non-tender; no hepatosplenomegaly, masses; active bowel sounds all quadrants GU: deferred Lymph: no cervical, axillary or inguinal adenopathy MSK: gait normal, muscle tone and strength WNL, no joint swelling, effusions, discoloration, crepitus  SKIN: clear, good turgor, color WNL, no rashes, lesions, or ulcerations Neuro: normal mental status, normal strength, sensation, and motion Psych: alert; oriented to person, place and time, normally interactive and not anxious or depressed in appearance.  All labs reviewed with patient. Results for orders placed or performed in visit on 10/08/21  Hepatitis C antibody  Result Value Ref Range   Hepatitis C Ab NON-REACTIVE NON-REACTIVE  HIV Antibody (routine testing w rflx)  Result Value Ref Range   HIV 1&2 Ab, 4th Generation NON-REACTIVE NON-REACTIVE  Hemoglobin A1c  Result Value Ref Range   Hgb A1c MFr Bld 6.1 4.6 - 6.5 %  CBC with Differential/Platelet  Result Value Ref Range   WBC 5.6 4.0 - 10.5 K/uL   RBC 5.70 4.22 - 5.81 Mil/uL   Hemoglobin 14.9 13.0 - 17.0 g/dL   HCT 10/10/21 75.1 - 02.5 %   MCV 78.6 78.0 - 100.0 fl   MCHC 33.2 30.0 - 36.0 g/dL   RDW 85.2 77.8 - 24.2 %   Platelets 250.0 150.0 - 400.0 K/uL   Neutrophils Relative % 64.9 43.0 - 77.0 %   Lymphocytes Relative 23.2 12.0 - 46.0 %   Monocytes Relative 8.3 3.0 - 12.0 %   Eosinophils Relative 2.8 0.0 - 5.0 %   Basophils Relative 0.8 0.0 - 3.0 %   Neutro Abs 3.6 1.4 - 7.7 K/uL   Lymphs Abs 1.3 0.7 - 4.0 K/uL   Monocytes Absolute 0.5 0.1 - 1.0 K/uL   Eosinophils Absolute 0.2 0.0 - 0.7 K/uL   Basophils Absolute 0.0 0.0 - 0.1  K/uL  Basic metabolic panel  Result Value Ref Range   Sodium 139 135 - 145 mEq/L   Potassium 3.9 3.5 - 5.1 mEq/L   Chloride 104 96 - 112 mEq/L   CO2 27 19 - 32 mEq/L   Glucose, Bld 107 (H) 70 - 99 mg/dL   BUN 14 6 - 23 mg/dL   Creatinine, Ser 35.3 0.40 - 1.50 mg/dL   GFR 6.14 43.15 mL/min   Calcium 8.9 8.4 - 10.5 mg/dL  Hepatic function panel  Result Value Ref Range   Total Bilirubin 0.6 0.2 - 1.2 mg/dL   Bilirubin, Direct 0.2 0.0 -  0.3 mg/dL   Alkaline Phosphatase 63 39 - 117 U/L   AST 39 (H) 0 - 37 U/L   ALT 25 0 - 53 U/L   Total Protein 6.9 6.0 - 8.3 g/dL   Albumin 4.2 3.5 - 5.2 g/dL  Lipid panel  Result Value Ref Range   Cholesterol 167 0 - 200 mg/dL   Triglycerides 21.1 0.0 - 149.0 mg/dL   HDL 94.17 >40.81 mg/dL   VLDL 44.8 0.0 - 18.5 mg/dL   LDL Cholesterol 631 (H) 0 - 99 mg/dL   Total CHOL/HDL Ratio 4    NonHDL 125.52     Assessment and Plan:     ICD-10-CM   1. Healthcare maintenance  Z00.00      He has become prediabetic with an A1c greater than 6 and his fasting blood sugars are also elevated.  He is now weighing 272 pounds, and he has gained some weight in the last few months.  We set a goal of 230 pounds at the next year and for the next 12 months.  I think that is achievable.  Aside from this, everything is doing pretty well.  He is improving after his recent sinusitis.  Health Maintenance Exam: The patient's preventative maintenance and recommended screening tests for an annual wellness exam were reviewed in full today. Brought up to date unless services declined.  Counselled on the importance of diet, exercise, and its role in overall health and mortality. The patient's FH and SH was reviewed, including their home life, tobacco status, and drug and alcohol status.  Follow-up in 1 year for physical exam or additional follow-up below.  Follow-up: No follow-ups on file. Or follow-up in 1 year if not noted.  No orders of the defined types were placed  in this encounter.  There are no discontinued medications. No orders of the defined types were placed in this encounter.   Signed,  Elpidio Galea. Mosetta Ferdinand, MD   Allergies as of 10/10/2021   No Known Allergies      Medication List        Accurate as of October 10, 2021  2:15 PM. If you have any questions, ask your nurse or doctor.          amoxicillin-clavulanate 875-125 MG tablet Commonly known as: AUGMENTIN Take 1 tablet by mouth 2 (two) times daily.   cyclobenzaprine 10 MG tablet Commonly known as: FLEXERIL Take 1 tablet (10 mg total) by mouth at bedtime as needed for muscle spasms.   fluticasone 50 MCG/ACT nasal spray Commonly known as: FLONASE Place 2 sprays into both nostrils daily as needed for allergies or rhinitis.   losartan 100 MG tablet Commonly known as: COZAAR TAKE 1 TABLET(100 MG) BY MOUTH DAILY   multivitamin tablet Take 1 tablet by mouth daily.   promethazine-dextromethorphan 6.25-15 MG/5ML syrup Commonly known as: PROMETHAZINE-DM Take 5 mLs by mouth 3 (three) times daily as needed for cough.

## 2021-10-18 ENCOUNTER — Encounter: Payer: Self-pay | Admitting: Family Medicine

## 2021-12-25 ENCOUNTER — Other Ambulatory Visit: Payer: Self-pay | Admitting: Family Medicine

## 2022-02-26 ENCOUNTER — Ambulatory Visit: Payer: Federal, State, Local not specified - PPO | Admitting: Nurse Practitioner

## 2022-02-26 ENCOUNTER — Encounter: Payer: Self-pay | Admitting: Nurse Practitioner

## 2022-02-26 VITALS — BP 134/76 | HR 83 | Temp 98.2°F | Resp 16 | Ht 69.0 in | Wt 262.0 lb

## 2022-02-26 DIAGNOSIS — U071 COVID-19: Secondary | ICD-10-CM | POA: Diagnosis not present

## 2022-02-26 DIAGNOSIS — Z20822 Contact with and (suspected) exposure to covid-19: Secondary | ICD-10-CM | POA: Diagnosis not present

## 2022-02-26 DIAGNOSIS — Z1152 Encounter for screening for COVID-19: Secondary | ICD-10-CM | POA: Diagnosis not present

## 2022-02-26 DIAGNOSIS — R051 Acute cough: Secondary | ICD-10-CM | POA: Insufficient documentation

## 2022-02-26 LAB — POC COVID19 BINAXNOW: SARS Coronavirus 2 Ag: POSITIVE — AB

## 2022-02-26 MED ORDER — MOLNUPIRAVIR EUA 200MG CAPSULE: 4.0000 | ORAL_CAPSULE | Freq: Two times a day (BID) | ORAL | 0 refills | Status: AC

## 2022-02-26 NOTE — Assessment & Plan Note (Signed)
Exposure to a coworker.

## 2022-02-26 NOTE — Patient Instructions (Signed)
Nice to see you today You can go back to work the night of 02/28/2022 IF you are feeling better and continue to be fever free I have sent in the antiviral medication to the pharmacy Follow up if no improvement

## 2022-02-26 NOTE — Assessment & Plan Note (Signed)
COVID-19 test positive in office.

## 2022-02-26 NOTE — Assessment & Plan Note (Signed)
COVID-19 positive in office.  Discussed antiviral treatment and they are EUA only.  After joint discussion did elect to use molnupiravir.  Did discuss CDC guidelines/recommendation regarding self-isolation/quarantine.  At work note was provided for patient.  Signs and symptoms reviewed when to seek urgent emergent healthcare.  Follow-up if no improvement.

## 2022-02-26 NOTE — Progress Notes (Signed)
Acute Office Visit  Subjective:     Patient ID: Andrew Stout, male    DOB: 1965-09-26, 56 y.o.   MRN: [id number]  Chief Complaint  Patient presents with   Nasal Congestion    X 4 days    Cough     Patient is in today for cough  Started on Friday States that he had a coworker come back from [place] and was out for a week. States that other coworkers that have a cough that does not seem to improve No covid test at home  Covid vaccine x2 and one booster Flu vaccine not up to date Has tried coricidin with minimal relief   Review of Systems  Constitutional:  Positive for chills and malaise/fatigue. Negative for fever.       Decreased taste  Appetite decreased Fluid intake increased  HENT:  Positive for congestion and sore throat (friday then resolved). Negative for ear discharge, ear pain and sinus pain.   Respiratory:  Positive for cough. Negative for shortness of breath.   Musculoskeletal:  Positive for back pain.  Neurological:  Positive for headaches (friday and resolved).        Objective:    BP 134/76   Pulse 83   Temp 98.2 F (36.8 C)   Resp 16   Ht 5\' 9"  (1.753 m)   Wt 262 lb (118.8 kg)   SpO2 98%   BMI 38.69 kg/m    Physical Exam Vitals and nursing note reviewed.  Constitutional:      Appearance: Normal appearance.  HENT:     Right Ear: Tympanic membrane, ear canal and external ear normal.     Left Ear: Tympanic membrane, ear canal and external ear normal.     Mouth/Throat:     Mouth: Mucous membranes are moist.     Pharynx: Oropharynx is clear.  Cardiovascular:     Rate and Rhythm: Normal rate and regular rhythm.     Heart sounds: Normal heart sounds.  Pulmonary:     Effort: Pulmonary effort is normal.     Breath sounds: Normal breath sounds.  Lymphadenopathy:     Cervical: No cervical adenopathy.  Neurological:     Mental Status: He is alert.     Results for orders placed or performed in visit on 02/26/22  POC COVID-19  BinaxNow  Result Value Ref Range   SARS Coronavirus 2 Ag Positive (A) Negative        Assessment & Plan:   Problem List Items Addressed This Visit       Other   [id number]    COVID-19 positive in office.  Discussed antiviral treatment and they are EUA only.  After joint discussion did elect to use molnupiravir.  Did discuss CDC guidelines/recommendation regarding self-isolation/quarantine.  At work note was provided for patient.  Signs and symptoms reviewed when to seek urgent emergent healthcare.  Follow-up if no improvement.      Relevant Medications   molnupiravir EUA (LAGEVRIO) 200 mg CAPS capsule   Acute cough    COVID-19 test positive in office.      Exposure to COVID-19 virus - Primary    Exposure to a coworker.      Relevant Orders   POC COVID-19 BinaxNow (Completed)    Meds ordered this encounter  Medications   molnupiravir EUA (LAGEVRIO) 200 mg CAPS capsule    Sig: Take 4 capsules (800 mg total) by mouth 2 (two) times daily for 5 days.    Dispense:  40 capsule    Refill:  0    Order Specific Question:   Supervising Provider    Answer:   Roxy Manns A [[id number]]    Return if symptoms worsen or fail to improve.  Audria Nine, NP

## 2022-05-03 ENCOUNTER — Ambulatory Visit
Admission: EM | Admit: 2022-05-03 | Discharge: 2022-05-03 | Disposition: A | Discharge status: Home / Self Care | Payer: Federal, State, Local not specified - PPO | Attending: Urgent Care | Admitting: Urgent Care

## 2022-05-03 DIAGNOSIS — S161XXA Strain of muscle, fascia and tendon at neck level, initial encounter: Secondary | ICD-10-CM | POA: Diagnosis not present

## 2022-05-03 MED ORDER — KETOROLAC TROMETHAMINE 60 MG/2ML IM SOLN
60.0000 mg | Freq: Once | INTRAMUSCULAR | Status: AC
  Administered 2022-05-03: 60 mg via INTRAMUSCULAR

## 2022-05-03 NOTE — Discharge Instructions (Addendum)
Recommended limited use of naproxen (1 tablet) every 12 hours for no more than 3-4 days given your solitary kidney. Use heat therapy with gentle stretching.

## 2022-05-03 NOTE — ED Provider Notes (Signed)
Roderic Palau    CSN: [id number] Arrival date & time: 05/03/22  1126      History   Chief Complaint No chief complaint on file.   HPI Trusten Normoyle Dobrowski is a 57 y.o. male.   HPI  Patient presents to urgent care with left-sided neck pain starting yesterday.  States he has pain when he turns his head to the left.  Patient is accompanied by his wife who states that patient has 1 kidney only congenitally.  Past Medical History:  Diagnosis Date   Allergy    seasonal   Chronic kidney disease    one kidney   Hypertension    Solitary kidney, congenital 05/10/2015    Patient Active Problem List   Diagnosis Date Noted   COVID-19 02/26/2022   Acute cough 02/26/2022   Exposure to COVID-19 virus 02/26/2022   Solitary kidney, congenital 05/10/2015   Right carpal tunnel syndrome 01/01/2013   Essential hypertension 03/07/2009    Past Surgical History:  Procedure Laterality Date   CHEST TUBE INSERTION     collasped lung in high school   NEPHROSTOMY  1975   left   TYMPANOSTOMY TUBE PLACEMENT  2010   right   VASECTOMY         Home Medications    Prior to Admission medications   Medication Sig Start Date End Date Taking? Authorizing Provider  cyclobenzaprine (FLEXERIL) 10 MG tablet Take 1 tablet (10 mg total) by mouth at bedtime as needed for muscle spasms. 05/16/21   Copland, Frederico Hamman, MD  fluticasone (FLONASE) 50 MCG/ACT nasal spray Place 2 sprays into both nostrils daily as needed for allergies or rhinitis.    [provider]  losartan (COZAAR) 100 MG tablet TAKE 1 TABLET(100 MG) BY MOUTH DAILY 12/25/21   Copland, Frederico Hamman, MD  Multiple Vitamin (MULTIVITAMIN) tablet Take 1 tablet by mouth daily.    [provider]    Family History Family History  Problem Relation Age of Onset   Transient ischemic attack Father     Social History Social History   Tobacco Use   Smoking status: Never   Smokeless tobacco: Never  Vaping Use   Vaping  Use: Never used  Substance Use Topics   Alcohol use: Yes    Comment: rare   Drug use: No     Allergies   Patient has no known allergies.   Review of Systems Review of Systems   Physical Exam Triage Vital Signs ED Triage Vitals [05/03/22 1216]  Enc Vitals Group     BP (!) 155/90     Pulse Rate 80     Resp 18     Temp 98.6 F (37 C)     Temp Source Oral     SpO2 96 %     Weight      Height      Head Circumference      Peak Flow      Pain Score      Pain Loc      Pain Edu?      Excl. in [place]?    No data found.  Updated Vital Signs BP (!) 155/90 (BP Location: Left Arm)   Pulse 80   Temp 98.6 F (37 C) (Oral)   Resp 18   SpO2 96%   Visual Acuity Right Eye Distance:   Left Eye Distance:   Bilateral Distance:    Right Eye Near:   Left Eye Near:    Bilateral Near:  Physical Exam Vitals reviewed.  Constitutional:      Appearance: Normal appearance.  Neck:     Comments: ROM limited by pain Musculoskeletal:     Cervical back: Pain with movement present. No spinous process tenderness or muscular tenderness. Decreased range of motion.  Skin:    General: Skin is warm and dry.  Neurological:     General: No focal deficit present.     Mental Status: He is alert and oriented to person, place, and time.  Psychiatric:        Mood and Affect: Mood normal.        Behavior: Behavior normal.      UC Treatments / Results  Labs (all labs ordered are listed, but only abnormal results are displayed) Labs Reviewed - No data to display  EKG   Radiology No results found.  Procedures Procedures (including critical care time)  Medications Ordered in UC Medications - No data to display  Initial Impression / Assessment and Plan / UC Course  I have reviewed the triage vital signs and the nursing notes.  Pertinent labs & imaging results that were available during my care of the patient were reviewed by me and considered in my medical decision making (see  chart for details).   Suspect cervical muscle spasm causing strain.  Toradol IM given in clinic.  GFR greater than 60.  Recommended limited use of NSAIDs to relieve inflammation and pain.  Also recommending heat therapy.   Final Clinical Impressions(s) / UC Diagnoses   Final diagnoses:  None   Discharge Instructions   None    ED Prescriptions   None    PDMP not reviewed this encounter.   Rose Phi, [place] 05/03/22 1241

## 2022-05-03 NOTE — ED Triage Notes (Signed)
Patient presents to UC for left sided neck pain since yesterday. States he was turning head to the left and developed a stiffness.Taking flexeril and Tylenol.

## 2022-10-08 ENCOUNTER — Ambulatory Visit: Payer: Federal, State, Local not specified - PPO | Admitting: Family Medicine

## 2022-10-08 ENCOUNTER — Encounter: Payer: Self-pay | Admitting: Family Medicine

## 2022-10-08 VITALS — BP 152/96 | HR 70 | Temp 97.6°F | Ht 69.0 in | Wt 257.4 lb

## 2022-10-08 DIAGNOSIS — J019 Acute sinusitis, unspecified: Secondary | ICD-10-CM | POA: Diagnosis not present

## 2022-10-08 DIAGNOSIS — H6501 Acute serous otitis media, right ear: Secondary | ICD-10-CM | POA: Insufficient documentation

## 2022-10-08 MED ORDER — AMOXICILLIN-POT CLAVULANATE 875-125 MG PO TABS: 1.0000 | ORAL_TABLET | Freq: Two times a day (BID) | ORAL | 0 refills | Status: AC

## 2022-10-08 NOTE — Assessment & Plan Note (Signed)
Treat with augmentin course given duration and progression of symptoms Discussed plain mucinex, nasal saline, nasal steroid use.  Update if not improving with treatment.

## 2022-10-08 NOTE — Progress Notes (Signed)
Ph: [phone] Fax: [phone]   Patient ID: Andrew Stout, male    DOB: Nov 26, 1965, 57 y.o.   MRN: [id number]  This visit was conducted in person.  BP (!) 152/96 (BP Location: Right Arm, Cuff Size: Large)   Pulse 70   Temp 97.6 F (36.4 C) (Temporal)   Ht 5\' 9"  (1.753 m)   Wt 257 lb 6 oz (116.7 kg)   SpO2 98%   BMI 38.01 kg/m   BP Readings from Last 3 Encounters:  10/08/22 (!) 152/96  05/03/22 (!) 155/90  02/26/22 134/76   CC: sinus congestion Subjective:   HPI: Andrew Stout is a 57 y.o. male presenting on 10/08/2022 for Sinus Problem (C/o sinus congestion, drainage and fluid in R ear. Sxs started about 2 wks ago. )   2 wk h/o R muffled hearing, R nasal congestion, head congestion and sinus pressure headache.  Itchy dry eyes has seen eye doctor for this.  No fevers/chills, facial pain, coughing, ST, PNDrainage, ear or tooth pain.  No sick contacts at home.   No h/o asthma. Non smoker.   Treating with flonase,  No decongestant use.   BP elevated today - this is despite losartan 100mg  daily.      Relevant past medical, surgical, family and social history reviewed and updated as indicated. Interim medical history since our last visit reviewed. Allergies and medications reviewed and updated. Outpatient Medications Prior to Visit  Medication Sig Dispense Refill   cyclobenzaprine (FLEXERIL) 10 MG tablet Take 1 tablet (10 mg total) by mouth at bedtime as needed for muscle spasms. 30 tablet 1   fluticasone (FLONASE) 50 MCG/ACT nasal spray Place 2 sprays into both nostrils daily as needed for allergies or rhinitis.     losartan (COZAAR) 100 MG tablet TAKE 1 TABLET(100 MG) BY MOUTH DAILY 90 tablet 3   Multiple Vitamin (MULTIVITAMIN) tablet Take 1 tablet by mouth daily.     No facility-administered medications prior to visit.     Per HPI unless specifically indicated in ROS section below Review of Systems  Objective:  BP (!) 152/96 (BP Location:  Right Arm, Cuff Size: Large)   Pulse 70   Temp 97.6 F (36.4 C) (Temporal)   Ht 5\' 9"  (1.753 m)   Wt 257 lb 6 oz (116.7 kg)   SpO2 98%   BMI 38.01 kg/m   Wt Readings from Last 3 Encounters:  10/08/22 257 lb 6 oz (116.7 kg)  02/26/22 262 lb (118.8 kg)  10/10/21 272 lb (123.4 kg)      Physical Exam Vitals and nursing note reviewed.  Constitutional:      Appearance: Normal appearance. He is not ill-appearing.  HENT:     Head: Normocephalic and atraumatic.     Right Ear: Ear canal and external ear normal. Decreased hearing noted. Swelling present. A middle ear effusion is present. There is no impacted cerumen. Tympanic membrane is retracted.     Left Ear: Hearing, tympanic membrane, ear canal and external ear normal. There is no impacted cerumen.     Ears:     Comments: Evident fluid behind R TM    Nose: Mucosal edema and congestion present. No rhinorrhea.     Right Turbinates: Enlarged and swollen.     Left Turbinates: Not enlarged or swollen.     Right Sinus: No maxillary sinus tenderness or frontal sinus tenderness.     Left Sinus: No maxillary sinus tenderness or frontal sinus tenderness.  Comments: R nasal mucosal erythema    Mouth/Throat:     Mouth: Mucous membranes are moist.     Pharynx: Oropharynx is clear. No oropharyngeal exudate or posterior oropharyngeal erythema.  Eyes:     Extraocular Movements: Extraocular movements intact.     Conjunctiva/sclera: Conjunctivae normal.     Pupils: Pupils are equal, round, and reactive to light.  Cardiovascular:     Rate and Rhythm: Normal rate and regular rhythm.     Pulses: Normal pulses.     Heart sounds: Normal heart sounds. No murmur heard. Pulmonary:     Effort: Pulmonary effort is normal. No respiratory distress.     Breath sounds: Normal breath sounds. No wheezing, rhonchi or rales.  Musculoskeletal:     Cervical back: Normal range of motion and neck supple. No rigidity.     Right lower leg: No edema.     Left  lower leg: No edema.  Lymphadenopathy:     Cervical: No cervical adenopathy.  Skin:    General: Skin is warm and dry.     Findings: No rash.  Neurological:     Mental Status: He is alert.  Psychiatric:        Mood and Affect: Mood normal.        Behavior: Behavior normal.        Assessment & Plan:   Problem List Items Addressed This Visit     Acute sinusitis - Primary    Treat with augmentin course given duration and progression of symptoms Discussed plain mucinex, nasal saline, nasal steroid use.  Update if not improving with treatment.       Relevant Medications   amoxicillin-clavulanate (AUGMENTIN) 875-125 MG tablet   Acute serous otitis media of right ear    See above - Rx augmentin course. Update if not starting to improve over next 1-2 wks or if not fully improved by 3-4 wks for ENT eval.  He has previously seen Dr Jenne Campus.       Relevant Medications   amoxicillin-clavulanate (AUGMENTIN) 875-125 MG tablet     Meds ordered this encounter  Medications   amoxicillin-clavulanate (AUGMENTIN) 875-125 MG tablet    Sig: Take 1 tablet by mouth 2 (two) times daily for 10 days.    Dispense:  20 tablet    Refill:  0    No orders of the defined types were placed in this encounter.   Patient Instructions  Start monitoring blood pressures at home. Let us know if consistently >140/90.  You do have sinus infection - treat with antibiotic sent to pharmacy.  Push fluids and plenty of rest. Nasal saline irrigation or neti pot to help drain sinuses. May use plain mucinex (fast relief guaifenesin) with plenty of fluid to help mobilize mucous. Please let us know if fever >101.5, trouble opening/closing mouth, difficulty swallowing, or worsening instead of improving as expected.   Follow up plan: Return if symptoms worsen or fail to improve.  Eustaquio Boyden, MD

## 2022-10-08 NOTE — Patient Instructions (Addendum)
Start monitoring blood pressures at home. Let us know if consistently >140/90.  You do have sinus infection - treat with antibiotic sent to pharmacy.  Push fluids and plenty of rest. Nasal saline irrigation or neti pot to help drain sinuses. May use plain mucinex (fast relief guaifenesin) with plenty of fluid to help mobilize mucous. Please let us know if fever >101.5, trouble opening/closing mouth, difficulty swallowing, or worsening instead of improving as expected.

## 2022-10-08 NOTE — Assessment & Plan Note (Signed)
See above - Rx augmentin course. Update if not starting to improve over next 1-2 wks or if not fully improved by 3-4 wks for ENT eval.  He has previously seen Dr Jenne Campus.

## 2022-10-28 ENCOUNTER — Telehealth: Payer: Self-pay | Admitting: Family Medicine

## 2022-10-28 NOTE — Telephone Encounter (Signed)
FYI: This call has been transferred to Access Nurse. Once the result note has been entered staff can address the message at that time.  Patient called in with the following symptoms:  Red Word: Possible reaction to antibiotic patient was prescribed    Please advise at Mobile [phone] (mobile)  Message is routed to Provider Pool and [facility] Triage

## 2022-10-29 ENCOUNTER — Encounter: Payer: Self-pay | Admitting: Family Medicine

## 2022-10-29 ENCOUNTER — Ambulatory Visit: Payer: Federal, State, Local not specified - PPO | Admitting: Family Medicine

## 2022-10-29 VITALS — BP 130/80 | HR 76 | Temp 98.1°F | Ht 69.0 in | Wt 257.0 lb

## 2022-10-29 DIAGNOSIS — L293 Anogenital pruritus, unspecified: Secondary | ICD-10-CM | POA: Diagnosis not present

## 2022-10-29 MED ORDER — FLUCONAZOLE 150 MG PO TABS: 150.0000 mg | ORAL_TABLET | Freq: Once | ORAL | 0 refills | Status: AC

## 2022-10-29 NOTE — Assessment & Plan Note (Signed)
Acute, given status post course of Augmentin symptoms are most likely due to candidal yeast infection.  Less likely tinea.  Given minimal diarrhea I doubt this is irritation from bowel movements.  Patient denies rectal bleeding and location of issues do not correspond with hemorrhoids.  Will treat with fluconazole 150 mg p.o. x 1, may repeat in 2 days if symptoms not resolved.

## 2022-10-29 NOTE — Progress Notes (Signed)
Patient ID: Andrew Stout, male    DOB: Apr 19, 1965, 57 y.o.   MRN: [id number]  This visit was conducted in person.  BP 130/80 (BP Location: Left Arm, Patient Position: Sitting, Cuff Size: Large)   Pulse 76   Temp 98.1 F (36.7 C) (Temporal)   Ht 5\' 9"  (1.753 m)   Wt 257 lb (116.6 kg)   SpO2 96%   BMI 37.95 kg/m    CC:  Chief Complaint  Patient presents with   Allergic Reaction    Possible reaction to Augmentin-Itching area rectal area-Seen by Dr. Reece Agar 10/08/22 for sinusitis    Subjective:   HPI: Andrew Stout is a 57 y.o. male presenting on 10/29/2022 for Allergic Reaction (Possible reaction to Augmentin-Itching area rectal area-Seen by Dr. Reece Agar 10/08/22 for sinusitis)  .  Reviewed office visit note from Eustaquio Boyden from October 08, 2022 for sinusitis.  Treated with 10-day course of Augmentin. Completed August 2  He reports resolution of sinus symptoms.   He has noted tarry stools, no diarrhea, no abdominal pain, rectal itching.  Stool  normal color.  Now still itching rectal area, buttock crack, scrotum area.  No odor , no discharge.   No fever.    Relevant past medical, surgical, family and social history reviewed and updated as indicated. Interim medical history since our last visit reviewed. Allergies and medications reviewed and updated. Outpatient Medications Prior to Visit  Medication Sig Dispense Refill   cyclobenzaprine (FLEXERIL) 10 MG tablet Take 1 tablet (10 mg total) by mouth at bedtime as needed for muscle spasms. 30 tablet 1   fluticasone (FLONASE) 50 MCG/ACT nasal spray Place 2 sprays into both nostrils daily as needed for allergies or rhinitis.     losartan (COZAAR) 100 MG tablet TAKE 1 TABLET(100 MG) BY MOUTH DAILY 90 tablet 3   Multiple Vitamin (MULTIVITAMIN) tablet Take 1 tablet by mouth daily.     No facility-administered medications prior to visit.     Per HPI unless specifically indicated in ROS section below Review of Systems   Constitutional:  Negative for fatigue and fever.  HENT:  Negative for ear pain.   Eyes:  Negative for pain.  Respiratory:  Negative for cough and shortness of breath.   Cardiovascular:  Negative for chest pain, palpitations and leg swelling.  Gastrointestinal:  Negative for abdominal pain.  Genitourinary:  Negative for dysuria.  Musculoskeletal:  Negative for arthralgias.  Neurological:  Negative for syncope, light-headedness and headaches.  Psychiatric/Behavioral:  Negative for dysphoric mood.    Objective:  BP 130/80 (BP Location: Left Arm, Patient Position: Sitting, Cuff Size: Large)   Pulse 76   Temp 98.1 F (36.7 C) (Temporal)   Ht 5\' 9"  (1.753 m)   Wt 257 lb (116.6 kg)   SpO2 96%   BMI 37.95 kg/m   Wt Readings from Last 3 Encounters:  10/29/22 257 lb (116.6 kg)  10/08/22 257 lb 6 oz (116.7 kg)  02/26/22 262 lb (118.8 kg)      Physical Exam Constitutional:      Appearance: He is well-developed.  HENT:     Head: Normocephalic.     Right Ear: Hearing normal.     Left Ear: Hearing normal.     Nose: Nose normal.  Neck:     Thyroid: No thyroid mass or thyromegaly.     Vascular: No carotid bruit.     Trachea: Trachea normal.  Cardiovascular:     Rate and Rhythm: Normal rate  and regular rhythm.     Pulses: Normal pulses.     Heart sounds: Heart sounds not distant. No murmur heard.    No friction rub. No gallop.     Comments: No peripheral edema Pulmonary:     Effort: Pulmonary effort is normal. No respiratory distress.     Breath sounds: Normal breath sounds.  Skin:    General: Skin is warm and dry.     Findings: No rash.     Comments: Slight erythema  at gluteal crease and at anus, no discharge, no blisters.  Psychiatric:        Speech: Speech normal.        Behavior: Behavior normal.        Thought Content: Thought content normal.       Results for orders placed or performed in visit on 02/26/22  POC COVID-19 BinaxNow  Result Value Ref Range   SARS  Coronavirus 2 Ag Positive (A) Negative    Assessment and Plan  Perineal irritation Assessment & Plan: Acute, given status post course of Augmentin symptoms are most likely due to candidal yeast infection.  Less likely tinea.  Given minimal diarrhea I doubt this is irritation from bowel movements.  Patient denies rectal bleeding and location of issues do not correspond with hemorrhoids.  Will treat with fluconazole 150 mg p.o. x 1, may repeat in 2 days if symptoms not resolved.   Other orders -     Fluconazole; Take 1 tablet (150 mg total) by mouth once for 1 dose. May repeat in 2 days if not resolved.  Dispense: 2 tablet; Refill: 0    No follow-ups on file.   Kerby Nora, MD

## 2022-10-29 NOTE — Telephone Encounter (Signed)
Pt has an appointment with Dr. Ermalene Searing today 10/29/22

## 2022-12-04 ENCOUNTER — Other Ambulatory Visit: Payer: Self-pay | Admitting: Family Medicine

## 2022-12-04 DIAGNOSIS — E785 Hyperlipidemia, unspecified: Secondary | ICD-10-CM

## 2022-12-04 DIAGNOSIS — Z131 Encounter for screening for diabetes mellitus: Secondary | ICD-10-CM

## 2022-12-04 DIAGNOSIS — Z79899 Other long term (current) drug therapy: Secondary | ICD-10-CM

## 2022-12-04 DIAGNOSIS — Z125 Encounter for screening for malignant neoplasm of prostate: Secondary | ICD-10-CM

## 2022-12-18 ENCOUNTER — Other Ambulatory Visit (INDEPENDENT_AMBULATORY_CARE_PROVIDER_SITE_OTHER): Payer: Federal, State, Local not specified - PPO

## 2022-12-18 DIAGNOSIS — E785 Hyperlipidemia, unspecified: Secondary | ICD-10-CM

## 2022-12-18 DIAGNOSIS — Z125 Encounter for screening for malignant neoplasm of prostate: Secondary | ICD-10-CM

## 2022-12-18 DIAGNOSIS — Z131 Encounter for screening for diabetes mellitus: Secondary | ICD-10-CM

## 2022-12-18 DIAGNOSIS — Z79899 Other long term (current) drug therapy: Secondary | ICD-10-CM | POA: Diagnosis not present

## 2022-12-18 LAB — LIPID PANEL
Cholesterol: 183 mg/dL (ref 0–200)
HDL: 48.1 mg/dL (ref >39.00)
LDL Cholesterol: 118 mg/dL — ABNORMAL HIGH (ref 0–99)
NonHDL: 134.96
Total CHOL/HDL Ratio: 4
Triglycerides: 83 mg/dL (ref 0.0–149.0)
VLDL: 16.6 mg/dL (ref 0.0–40.0)

## 2022-12-18 LAB — CBC WITH DIFFERENTIAL/PLATELET
Basophils Absolute: 0.1 10*3/uL (ref 0.0–0.1)
Basophils Relative: 0.9 % (ref 0.0–3.0)
Eosinophils Absolute: 0.2 10*3/uL (ref 0.0–0.7)
Eosinophils Relative: 2.7 % (ref 0.0–5.0)
HCT: 43.5 % (ref 39.0–52.0)
Hemoglobin: 14.1 g/dL (ref 13.0–17.0)
Lymphocytes Relative: 28.5 % (ref 12.0–46.0)
Lymphs Abs: 1.9 10*3/uL (ref 0.7–4.0)
MCHC: 32.4 g/dL (ref 30.0–36.0)
MCV: 81.5 fL (ref 78.0–100.0)
Monocytes Absolute: 0.5 10*3/uL (ref 0.1–1.0)
Monocytes Relative: 7.3 % (ref 3.0–12.0)
Neutro Abs: 4 10*3/uL (ref 1.4–7.7)
Neutrophils Relative %: 60.6 % (ref 43.0–77.0)
Platelets: 276 10*3/uL (ref 150.0–400.0)
RBC: 5.34 Mil/uL (ref 4.22–5.81)
RDW: 14.5 % (ref 11.5–15.5)
WBC: 6.6 10*3/uL (ref 4.0–10.5)

## 2022-12-18 LAB — HEMOGLOBIN A1C: Hgb A1c MFr Bld: 5.9 % (ref 4.6–6.5)

## 2022-12-18 LAB — BASIC METABOLIC PANEL
BUN: 17 mg/dL (ref 6–23)
CO2: 29 meq/L (ref 19–32)
Calcium: 9.1 mg/dL (ref 8.4–10.5)
Chloride: 103 meq/L (ref 96–112)
Creatinine, Ser: 1.41 mg/dL (ref 0.40–1.50)
GFR: 55.7 mL/min — ABNORMAL LOW (ref >60.00)
Glucose, Bld: 92 mg/dL (ref 70–99)
Potassium: 3.9 meq/L (ref 3.5–5.1)
Sodium: 138 meq/L (ref 135–145)

## 2022-12-18 LAB — HEPATIC FUNCTION PANEL
ALT: 17 U/L (ref 0–53)
AST: 27 U/L (ref 0–37)
Albumin: 3.9 g/dL (ref 3.5–5.2)
Alkaline Phosphatase: 62 U/L (ref 39–117)
Bilirubin, Direct: 0.1 mg/dL (ref 0.0–0.3)
Total Bilirubin: 0.6 mg/dL (ref 0.2–1.2)
Total Protein: 6.3 g/dL (ref 6.0–8.3)

## 2022-12-20 LAB — PSA, TOTAL WITH REFLEX TO PSA, FREE: PSA, Total: 0.3 ng/mL (ref <=4.0)

## 2022-12-22 NOTE — Progress Notes (Unsigned)
Andrew Polo T. Katleen Carraway, MD, CAQ Sports Medicine [facility] [address] [place], [address]  Phone: [phone]  FAX: [phone]  Andrew Stout - 57 y.o. male  MRN [id number]  Date of Birth: [birth year]  Date: 12/23/2022  PCP: Hannah Beat, MD  Referral: Hannah Beat, MD  No chief complaint on file.  Patient Care Team: Hannah Beat, MD as PCP - General Subjective:   Andrew Stout is a 57 y.o. pleasant patient who presents with the following:  Preventative Health Maintenance Visit:  Health Maintenance Summary Reviewed and updated, unless pt declines services.  Tobacco History Reviewed. Alcohol: No concerns, no excessive use Exercise Habits: Some activity, rec at least 30 mins 5 times a week STD concerns: no risk or activity to increase risk Drug Use: None  He is a very well-known patient, having been known for many years.  He generally only shows up for physicals, though he has had some intermittent problems with his back over the last few years.  Covid booster  Health Maintenance  Topic Date Due   COVID-19 Vaccine (4 - 2023-24 season) 11/17/2022   INFLUENZA VACCINE  06/16/2023 (Originally 10/17/2022)   DTaP/Tdap/Td (3 - Td or Tdap) 05/09/2025   Colonoscopy  01/03/2028   Hepatitis C Screening  Completed   HIV Screening  Completed   Zoster Vaccines- Shingrix  Completed   HPV VACCINES  Aged Out   Immunization History  Administered Date(s) Administered   Influenza Inj Mdck Quad Pf 12/25/2017   Influenza Split 12/10/2010, 01/06/2012   Influenza,inj,Quad PF,6+ Mos 01/13/2019   Influenza-Unspecified 12/17/2014, 02/14/2017   Moderna Sars-Covid-2 Vaccination 05/26/2019, 06/23/2019, 02/21/2020   Td 03/18/2005   Tdap 05/10/2015   Zoster Recombinant(Shingrix) 01/13/2019, 03/22/2019   Patient Active Problem List   Diagnosis Date Noted   Solitary kidney, congenital 05/10/2015   Right carpal tunnel syndrome  01/01/2013   Essential hypertension 03/07/2009    Past Medical History:  Diagnosis Date   Allergy    seasonal   Chronic kidney disease    one kidney   Hypertension    Solitary kidney, congenital 05/10/2015    Past Surgical History:  Procedure Laterality Date   CHEST TUBE INSERTION     collasped lung in high school   NEPHROSTOMY  1975   left   TYMPANOSTOMY TUBE PLACEMENT  2010   right   VASECTOMY      Family History  Problem Relation Age of Onset   Transient ischemic attack Father     Social History   Social History Narrative   Regular exercise-no    Past Medical History, Surgical History, Social History, Family History, Problem List, Medications, and Allergies have been reviewed and updated if relevant.  Review of Systems: Pertinent positives are listed above.  Otherwise, a full 14 point review of systems has been done in full and it is negative except where it is noted positive.  Objective:   There were no vitals taken for this visit. Ideal Body Weight:    Ideal Body Weight:   No results found.    10/08/2022    2:01 PM 10/10/2021    2:12 PM 06/14/2020   11:30 AM 01/13/2019    2:36 PM 11/03/2017    9:26 AM  Depression screen PHQ 2/9  Decreased Interest 0 0 0 0 0  Down, Depressed, Hopeless 0 0 0 0 0  PHQ - 2 Score 0 0 0 0 0  Altered sleeping 1  Tired, decreased energy 1      Change in appetite 1      Feeling bad or failure about yourself  0      Trouble concentrating 0      Moving slowly or fidgety/restless 0      Suicidal thoughts 0      PHQ-9 Score 3      Difficult doing work/chores Not difficult at all         GEN: well developed, well nourished, no acute distress Eyes: conjunctiva and lids normal, PERRLA, EOMI ENT: TM clear, nares clear, oral exam WNL Neck: supple, no lymphadenopathy, no thyromegaly, no JVD Pulm: clear to auscultation and percussion, respiratory effort normal CV: regular rate and rhythm, S1-S2, no murmur, rub or gallop, no  bruits, peripheral pulses normal and symmetric, no cyanosis, clubbing, edema or varicosities GI: soft, non-tender; no hepatosplenomegaly, masses; active bowel sounds all quadrants GU: deferred Lymph: no cervical, axillary or inguinal adenopathy MSK: gait normal, muscle tone and strength WNL, no joint swelling, effusions, discoloration, crepitus  SKIN: clear, good turgor, color WNL, no rashes, lesions, or ulcerations Neuro: normal mental status, normal strength, sensation, and motion Psych: alert; oriented to person, place and time, normally interactive and not anxious or depressed in appearance.  All labs reviewed with patient. Results for orders placed or performed in visit on 12/18/22  PSA, Total with Reflex to PSA, Free  Result Value Ref Range   PSA, Total 0.3 < OR = 4.0 ng/mL  Lipid panel  Result Value Ref Range   Cholesterol 183 0 - 200 mg/dL   Triglycerides 59.5 0.0 - 149.0 mg/dL   HDL 63.87 >56.43 mg/dL   VLDL 32.9 0.0 - 51.8 mg/dL   LDL Cholesterol 841 (H) 0 - 99 mg/dL   Total CHOL/HDL Ratio 4    NonHDL 134.96   Hemoglobin A1c  Result Value Ref Range   Hgb A1c MFr Bld 5.9 4.6 - 6.5 %  Hepatic function panel  Result Value Ref Range   Total Bilirubin 0.6 0.2 - 1.2 mg/dL   Bilirubin, Direct 0.1 0.0 - 0.3 mg/dL   Alkaline Phosphatase 62 39 - 117 U/L   AST 27 0 - 37 U/L   ALT 17 0 - 53 U/L   Total Protein 6.3 6.0 - 8.3 g/dL   Albumin 3.9 3.5 - 5.2 g/dL  CBC with Differential/Platelet  Result Value Ref Range   WBC 6.6 4.0 - 10.5 K/uL   RBC 5.34 4.22 - 5.81 Mil/uL   Hemoglobin 14.1 13.0 - 17.0 g/dL   HCT 66.0 63.0 - 16.0 %   MCV 81.5 78.0 - 100.0 fl   MCHC 32.4 30.0 - 36.0 g/dL   RDW 10.9 32.3 - 55.7 %   Platelets 276.0 150.0 - 400.0 K/uL   Neutrophils Relative % 60.6 43.0 - 77.0 %   Lymphocytes Relative 28.5 12.0 - 46.0 %   Monocytes Relative 7.3 3.0 - 12.0 %   Eosinophils Relative 2.7 0.0 - 5.0 %   Basophils Relative 0.9 0.0 - 3.0 %   Neutro Abs 4.0 1.4 - 7.7 K/uL    Lymphs Abs 1.9 0.7 - 4.0 K/uL   Monocytes Absolute 0.5 0.1 - 1.0 K/uL   Eosinophils Absolute 0.2 0.0 - 0.7 K/uL   Basophils Absolute 0.1 0.0 - 0.1 K/uL  Basic metabolic panel  Result Value Ref Range   Sodium 138 135 - 145 mEq/L   Potassium 3.9 3.5 - 5.1 mEq/L   Chloride 103 96 -  112 mEq/L   CO2 29 19 - 32 mEq/L   Glucose, Bld 92 70 - 99 mg/dL   BUN 17 6 - 23 mg/dL   Creatinine, Ser 7.84 0.40 - 1.50 mg/dL   GFR 69.62 (L) >95.28 mL/min   Calcium 9.1 8.4 - 10.5 mg/dL    Assessment and Plan:     ICD-10-CM   1. Healthcare maintenance  Z00.00       Health Maintenance Exam: The patient's preventative maintenance and recommended screening tests for an annual wellness exam were reviewed in full today. Brought up to date unless services declined.  Counselled on the importance of diet, exercise, and its role in overall health and mortality. The patient's FH and SH was reviewed, including their home life, tobacco status, and drug and alcohol status.  Follow-up in 1 year for physical exam or additional follow-up below.  Disposition: No follow-ups on file.  No orders of the defined types were placed in this encounter.  There are no discontinued medications. No orders of the defined types were placed in this encounter.   Signed,  Elpidio Galea. Abbigail Anstey, MD   Allergies as of 12/23/2022   No Known Allergies      Medication List        Accurate as of December 22, 2022  1:58 PM. If you have any questions, ask your nurse or doctor.          cyclobenzaprine 10 MG tablet Commonly known as: FLEXERIL Take 1 tablet (10 mg total) by mouth at bedtime as needed for muscle spasms.   fluticasone 50 MCG/ACT nasal spray Commonly known as: FLONASE Place 2 sprays into both nostrils daily as needed for allergies or rhinitis.   losartan 100 MG tablet Commonly known as: COZAAR TAKE 1 TABLET(100 MG) BY MOUTH DAILY   multivitamin tablet Take 1 tablet by mouth daily.

## 2022-12-23 ENCOUNTER — Ambulatory Visit (INDEPENDENT_AMBULATORY_CARE_PROVIDER_SITE_OTHER): Payer: Federal, State, Local not specified - PPO | Admitting: Family Medicine

## 2022-12-23 VITALS — BP 134/82 | HR 88 | Temp 97.6°F | Ht 69.0 in | Wt 257.0 lb

## 2022-12-23 DIAGNOSIS — Z0000 Encounter for general adult medical examination without abnormal findings: Secondary | ICD-10-CM | POA: Diagnosis not present

## 2022-12-23 MED ORDER — LOSARTAN POTASSIUM 100 MG PO TABS: 100.0000 mg | ORAL_TABLET | Freq: Every day | ORAL | 3 refills | Status: DC

## 2022-12-23 MED ORDER — FLUTICASONE PROPIONATE 50 MCG/ACT NA SUSP: 2.0000 | Freq: Every day | NASAL | 3 refills | Status: AC | PRN

## 2023-04-11 ENCOUNTER — Ambulatory Visit
Admission: RE | Admit: 2023-04-11 | Discharge: 2023-04-11 | Disposition: A | Discharge status: Home / Self Care | Payer: Federal, State, Local not specified - PPO | Source: Ambulatory Visit | Attending: Emergency Medicine

## 2023-04-11 ENCOUNTER — Ambulatory Visit: Payer: Self-pay | Admitting: Family Medicine

## 2023-04-11 VITALS — BP 134/89 | HR 94 | Temp 98.6°F | Resp 18

## 2023-04-11 DIAGNOSIS — J101 Influenza due to other identified influenza virus with other respiratory manifestations: Secondary | ICD-10-CM | POA: Diagnosis not present

## 2023-04-11 LAB — POC COVID19/FLU A&B COMBO
Covid Antigen, POC: NEGATIVE
Influenza A Antigen, POC: POSITIVE — AB
Influenza B Antigen, POC: NEGATIVE

## 2023-04-11 NOTE — Telephone Encounter (Signed)
Patient is already schedule at Urgent today at 10: 30 am.

## 2023-04-11 NOTE — Telephone Encounter (Signed)
Copied from CRM [id number]. Topic: Clinical - Red Word Triage >> Apr 11, 2023  7:46 AM Andrew Stout wrote: Red Word that prompted transfer to Nurse Triage: Patient called in due to Left Nostril started sore and runny, cough, fever of 101 , on and off headache, cramping/numbness in thigh and hands   Chief Complaint:  Patient called in due to Left Nostril started sore and runny, cough, fever of 102, on and off headache, cramping in  thigh and  numbness of an on in finger tips - Dx Carpel Tunnel hands Symptoms: Fever 103 on yesterday  and 102 today Runny nose  and cough Headache Frequency:  Day 2 of Fever  Pertinent Negatives: Patient denies being around anyone that has been sick. Disposition: [] ED /[] Urgent Care (no appt availability in office) / [] Appointment(In office/virtual)/ []  [facility]/ [x] Home Care/ [] Refused Recommended Disposition /[] [facility]/ []  Follow-up with PCP Additional Notes:   Disposition was Home Care .  Patient requested to schedule at [facility]  for today. Patient will walk in  since no early appointments  are available Reason for Disposition  [1] Fever AND [2] no signs of serious infection or localizing symptoms (all other triage questions negative)  Answer Assessment - Initial Assessment Questions 1. TEMPERATURE: "What is the most recent temperature?"  "How was it measured?"       Yesterday 103.3  today 102.0   2. ONSET: "When did the fever start?"        Day 2  3. CHILLS: "Do you have chills?" If yes: "How bad are they?"  (e.g., none, mild, moderate, severe)    - MILD: feeling cold          4. OTHER SYMPTOMS: "Do you have any other symptoms besides the fever?"  (e.g., abdomen pain, cough, diarrhea, earache, headache, sore throat, urination pain)     Left Nostril started sore and runny, cough, fever of 101 , on and off headache, cramping in thigh and hands   5. CAUSE: If there are no symptoms, ask: "What do you think is  causing the fever?"       Unsure  -  He has seasonal allergies  Numbess  in both of and off  Finger tip- dx carpal 6. CONTACTS: "Does anyone else in the family have an infection?"      Denies 7. TREATMENT: "What have you done so far to treat this fever?" (e.g., medications)      8. IMMUNOCOMPROMISE: "Do you have of the following: diabetes, HIV positive, splenectomy, cancer chemotherapy, chronic steroid treatment, transplant patient, etc."          . TRAVEL: "Have you traveled out of the country in the last month?" (e.g., travel history, exposures)        Denies  Protocols used: [facility]

## 2023-04-11 NOTE — Discharge Instructions (Addendum)
The flu test is positive for Influenza A.  COVID is negative.   Take Tylenol or ibuprofen as needed for fever or discomfort.  Take plain Mucinex as needed for congestion.  Rest and keep yourself hydrated.    Follow-up with your primary care provider if your symptoms are not improving.

## 2023-04-11 NOTE — ED Triage Notes (Signed)
Patient to Urgent Care with complaints of fevers (max 100.3) / cough/ nasal drainage/ congestion/ headaches.   Reports symptoms started four days ago. Started feeling worse last night.  Meds: tylenol/ delsym.

## 2023-04-11 NOTE — ED Provider Notes (Signed)
Andrew Stout    CSN: [id number] Arrival date & time: 04/11/23  1014      History   Chief Complaint Chief Complaint  Patient presents with   Fever    Sinus drainage and cough - Entered by patient    HPI Andrew Stout is a 58 y.o. male.  Patient presents with 4-day history of fever, congestion, cough.  He has been treating his symptoms with Tylenol and Delsym.  His cough is nonproductive.  No fever or shortness of breath.  The history is provided by the patient and medical records.    Past Medical History:  Diagnosis Date   Allergy    seasonal   Chronic kidney disease    one kidney   Hypertension    Solitary kidney, congenital 05/10/2015    Patient Active Problem List   Diagnosis Date Noted   Solitary kidney, congenital 05/10/2015   Right carpal tunnel syndrome 01/01/2013   Essential hypertension 03/07/2009    Past Surgical History:  Procedure Laterality Date   CHEST TUBE INSERTION     collasped lung in high school   NEPHROSTOMY  1975   left   TYMPANOSTOMY TUBE PLACEMENT  2010   right   VASECTOMY         Home Medications    Prior to Admission medications   Medication Sig Start Date End Date Taking? Authorizing Provider  cyclobenzaprine (FLEXERIL) 10 MG tablet Take 1 tablet (10 mg total) by mouth at bedtime as needed for muscle spasms. 05/16/21   Copland, Karleen Hampshire, MD  fluticasone (FLONASE) 50 MCG/ACT nasal spray Place 2 sprays into both nostrils daily as needed for allergies or rhinitis. 12/23/22   Copland, Karleen Hampshire, MD  losartan (COZAAR) 100 MG tablet Take 1 tablet (100 mg total) by mouth daily. 12/23/22   Copland, Karleen Hampshire, MD  Multiple Vitamin (MULTIVITAMIN) tablet Take 1 tablet by mouth daily.    [provider]    Family History Family History  Problem Relation Age of Onset   Transient ischemic attack Father     Social History Social History   Tobacco Use   Smoking status: Never   Smokeless tobacco: Never  Vaping Use    Vaping status: Never Used  Substance Use Topics   Alcohol use: Yes    Comment: rare   Drug use: No     Allergies   Patient has no known allergies.   Review of Systems Review of Systems  Constitutional:  Positive for chills. Negative for fever.  HENT:  Positive for congestion. Negative for ear pain and sore throat.   Respiratory:  Positive for cough. Negative for shortness of breath.   Gastrointestinal:  Negative for diarrhea and vomiting.     Physical Exam Triage Vital Signs ED Triage Vitals  Encounter Vitals Group     BP 04/11/23 1053 134/89     Systolic BP Percentile --      Diastolic BP Percentile --      Pulse Rate 04/11/23 1027 94     Resp 04/11/23 1027 18     Temp 04/11/23 1027 98.6 F (37 C)     Temp src --      SpO2 04/11/23 1027 96 %     Weight --      Height --      Head Circumference --      Peak Flow --      Pain Score 04/11/23 1050 0     Pain Loc --  Pain Education --      Exclude from Growth Chart --    No data found.  Updated Vital Signs BP 134/89   Pulse 94   Temp 98.6 F (37 C)   Resp 18   SpO2 96%   Visual Acuity Right Eye Distance:   Left Eye Distance:   Bilateral Distance:    Right Eye Near:   Left Eye Near:    Bilateral Near:     Physical Exam Constitutional:      General: He is not in acute distress. HENT:     Right Ear: Tympanic membrane normal.     Left Ear: Tympanic membrane normal.     Nose: Nose normal.     Mouth/Throat:     Mouth: Mucous membranes are moist.     Pharynx: Oropharynx is clear.  Cardiovascular:     Rate and Rhythm: Normal rate and regular rhythm.     Heart sounds: Normal heart sounds.  Pulmonary:     Effort: Pulmonary effort is normal. No respiratory distress.     Breath sounds: Normal breath sounds.  Neurological:     Mental Status: He is alert.      UC Treatments / Results  Labs (all labs ordered are listed, but only abnormal results are displayed) Labs Reviewed  POC COVID19/FLU A&B  COMBO - Abnormal; Notable for the following components:      Result Value   Influenza A Antigen, POC Positive (*)    All other components within normal limits    EKG   Radiology No results found.  Procedures Procedures (including critical care time)  Medications Ordered in UC Medications - No data to display  Initial Impression / Assessment and Plan / UC Course  I have reviewed the triage vital signs and the nursing notes.  Pertinent labs & imaging results that were available during my care of the patient were reviewed by me and considered in my medical decision making (see chart for details).    Influenza A.  Rapid flu test positive for influenza A.  COVID negative.  Patient is outside the window for treatment.  Discussed symptomatic treatment including Tylenol or ibuprofen as needed, rest, hydration.  Education provided on influenza.  Instructed patient to follow-up with PCP if symptoms are not improving.  Patient agrees to plan of care.   Final Clinical Impressions(s) / UC Diagnoses   Final diagnoses:  Influenza A     Discharge Instructions      The flu test is positive for Influenza A.  COVID is negative.   Take Tylenol or ibuprofen as needed for fever or discomfort.  Take plain Mucinex as needed for congestion.  Rest and keep yourself hydrated.    Follow-up with your primary care provider if your symptoms are not improving.         ED Prescriptions   None    PDMP not reviewed this encounter.   Mickie Bail, NP 04/11/23 1121

## 2023-05-14 ENCOUNTER — Other Ambulatory Visit: Payer: Self-pay | Admitting: Family Medicine

## 2023-05-14 NOTE — Telephone Encounter (Signed)
 Last office visit 12/23/2022 for CPE.  Last refilled 05/25/2021 for #30 with 1 refill.  Next Appt: No future appointments.

## 2023-05-20 ENCOUNTER — Other Ambulatory Visit: Payer: Self-pay | Admitting: Family Medicine

## 2023-05-20 MED ORDER — SCOPOLAMINE 1 MG/3DAYS TD PT72: 1.0000 | MEDICATED_PATCH | TRANSDERMAL | 1 refills | Status: DC

## 2023-05-20 NOTE — Telephone Encounter (Signed)
 Last Fill: Unknown  Last OV: 12/23/22 Next OV: None Scheduled  Routing to provider for review/authorization.

## 2023-05-20 NOTE — Telephone Encounter (Signed)
 Copied from CRM [id number]. Topic: Clinical - Medication Refill >> May 20, 2023  2:12 PM Orinda Kenner C wrote: Most Recent Primary Care Visit:  Provider: Hannah Beat  Department: [facility]  Visit Type: PHYSICAL  Date: 12/23/2022  Medication: scopolamine (TRANSDERM-SCOP, 1.5 MG,) 1 MG/3 days patch   Has the patient contacted their pharmacy? Yes (Agent: If no, request that the patient contact the pharmacy for the refill. If patient does not wish to contact the pharmacy document the reason why and proceed with request.) (Agent: If yes, when and what did the pharmacy advise?)  Is this the correct pharmacy for this prescription? Yes If no, delete pharmacy and type the correct one.  This is the patient's preferred pharmacy:  [id number] [place], [place] - [address] AT [facility] OF [address] [place] [address] Phone: [phone] Fax: [phone]   Has the prescription been filled recently? No  Is the patient out of the medication? Yes. Patient is going a cruise in few weeks and needs medication.   Has the patient been seen for an appointment in the last year OR does the patient have an upcoming appointment? Yes  Can we respond through MyChart? Yes  Agent: Please be advised that Rx refills may take up to 3 business days. We ask that you follow-up with your pharmacy.

## 2023-07-03 ENCOUNTER — Other Ambulatory Visit: Payer: Self-pay

## 2023-07-03 ENCOUNTER — Ambulatory Visit
Admission: RE | Admit: 2023-07-03 | Discharge: 2023-07-03 | Disposition: A | Discharge status: Home / Self Care | Source: Ambulatory Visit | Attending: Family Medicine

## 2023-07-03 VITALS — BP 152/92 | HR 70 | Temp 99.0°F | Resp 18

## 2023-07-03 DIAGNOSIS — J0110 Acute frontal sinusitis, unspecified: Secondary | ICD-10-CM | POA: Diagnosis not present

## 2023-07-03 DIAGNOSIS — R051 Acute cough: Secondary | ICD-10-CM | POA: Diagnosis not present

## 2023-07-03 MED ORDER — AMOXICILLIN-POT CLAVULANATE 875-125 MG PO TABS
1.0000 | ORAL_TABLET | Freq: Two times a day (BID) | ORAL | 0 refills | Status: AC
Start: 2023-07-03 — End: 2023-07-13

## 2023-07-03 MED ORDER — PROMETHAZINE-DM 6.25-15 MG/5ML PO SYRP
5.0000 mL | ORAL_SOLUTION | Freq: Four times a day (QID) | ORAL | 0 refills | Status: DC | PRN
Start: 2023-07-03 — End: 2023-10-29

## 2023-07-03 NOTE — ED Triage Notes (Signed)
 Patient presents to [facility] for evaluation of sore throat since Friday of last week.  Daughter was sick as well, saw her doctor Monday and was diagnosed with a sinus infection.  Patient c/o bilateral ear pressure with some dizziness, lack of appetite, cough that causes a sharp pain in his head.  He decided to be seen due to how long he has been sick  Denies fevers

## 2023-07-03 NOTE — Discharge Instructions (Signed)
 Complete entire course of antibiotics and Promethazine DM take as needed for cough. Symptoms should gradually improved.  If symptoms have not improved following completion of treatment return for reevaluation.

## 2023-07-03 NOTE — ED Provider Notes (Signed)
 Andrew Stout    CSN: [id number] Arrival date & time: 07/03/23  1337      History   Chief Complaint Chief Complaint  Patient presents with   URI    HPI Andrew Stout is a 58 y.o. male.  Patient presents for evaluation of sinus pressure, cough, sore throat (resolved), nasal congestion.  Symptoms have been present for 6 days.  Patient has been taking multiple over-the-counter medication and allergy medications to try and alleviate current symptoms.  He reports symptoms have remained unchanged and cough is becoming more persistent.  He has not had fever.  His daughter had a similar upper respiratory illness as resolved.  He did not test himself or COVID or flu.  No history of asthma.  Past Medical History:  Diagnosis Date   Allergy    seasonal   Chronic kidney disease    one kidney   Hypertension    Solitary kidney, congenital 05/10/2015    Patient Active Problem List   Diagnosis Date Noted   Solitary kidney, congenital 05/10/2015   Right carpal tunnel syndrome 01/01/2013   Essential hypertension 03/07/2009    Past Surgical History:  Procedure Laterality Date   CHEST TUBE INSERTION     collasped lung in high school   NEPHROSTOMY  1975   left   TYMPANOSTOMY TUBE PLACEMENT  2010   right   VASECTOMY         Home Medications    Prior to Admission medications   Medication Sig Start Date End Date Taking? Authorizing Provider  amoxicillin-clavulanate (AUGMENTIN) 875-125 MG tablet Take 1 tablet by mouth 2 (two) times daily for 10 days. 07/03/23 07/13/23 Yes Bing Neighbors, NP  promethazine-dextromethorphan (PROMETHAZINE-DM) 6.25-15 MG/5ML syrup Take 5 mLs by mouth 4 (four) times daily as needed for cough. 07/03/23  Yes Bing Neighbors, NP  cyclobenzaprine (FLEXERIL) 10 MG tablet TAKE 1 TABLET(10 MG) BY MOUTH AT BEDTIME AS NEEDED FOR MUSCLE SPASMS 05/15/23   Copland, Karleen Hampshire, MD  fluticasone (FLONASE) 50 MCG/ACT nasal spray Place 2 sprays into both  nostrils daily as needed for allergies or rhinitis. 12/23/22   Copland, Karleen Hampshire, MD  losartan (COZAAR) 100 MG tablet Take 1 tablet (100 mg total) by mouth daily. 12/23/22   Copland, Karleen Hampshire, MD  Multiple Vitamin (MULTIVITAMIN) tablet Take 1 tablet by mouth daily.    [provider]  scopolamine (TRANSDERM-SCOP) 1 MG/3DAYS Place 1 patch (1.5 mg total) onto the skin every 3 (three) days. 05/20/23   Copland, Karleen Hampshire, MD    Family History Family History  Problem Relation Age of Onset   Transient ischemic attack Father     Social History Social History   Tobacco Use   Smoking status: Never   Smokeless tobacco: Never  Vaping Use   Vaping status: Never Used  Substance Use Topics   Alcohol use: Yes    Comment: rare   Drug use: No     Allergies   Patient has no known allergies.   Review of Systems Review of Systems Pertinent negatives listed in HPI   Physical Exam Triage Vital Signs ED Triage Vitals [07/03/23 1352]  Encounter Vitals Group     BP (!) 152/92     Systolic BP Percentile      Diastolic BP Percentile      Pulse Rate 70     Resp 18     Temp 99 F (37.2 C)     Temp Source Oral     SpO2 97 %  Weight      Height      Head Circumference      Peak Flow      Pain Score 0     Pain Loc      Pain Education      Exclude from Growth Chart    No data found.  Updated Vital Signs BP (!) 152/92 (BP Location: Left Arm)   Pulse 70   Temp 99 F (37.2 C) (Oral)   Resp 18   SpO2 97%   Visual Acuity Right Eye Distance:   Left Eye Distance:   Bilateral Distance:    Right Eye Near:   Left Eye Near:    Bilateral Near:     Physical Exam Vitals reviewed.  Constitutional:      Appearance: Normal appearance.  HENT:     Head: Normocephalic and atraumatic.     Right Ear: A middle ear effusion is present. Tympanic membrane is erythematous.     Left Ear: A middle ear effusion is present.     Nose: Congestion and rhinorrhea present.     Mouth/Throat:      Pharynx: No oropharyngeal exudate or posterior oropharyngeal erythema.  Eyes:     Extraocular Movements: Extraocular movements intact.     Conjunctiva/sclera: Conjunctivae normal.     Pupils: Pupils are equal, round, and reactive to light.  Cardiovascular:     Rate and Rhythm: Normal rate and regular rhythm.  Pulmonary:     Effort: Pulmonary effort is normal.     Breath sounds: Normal breath sounds.  Musculoskeletal:     Cervical back: Normal range of motion and neck supple.  Lymphadenopathy:     Cervical: Cervical adenopathy present.  Skin:    General: Skin is warm and dry.  Neurological:     General: No focal deficit present.     Mental Status: He is alert.      UC Treatments / Results  Labs (all labs ordered are listed, but only abnormal results are displayed) Labs Reviewed - No data to display  EKG   Radiology No results found.  Procedures Procedures (including critical care time)  Medications Ordered in UC Medications - No data to display  Initial Impression / Assessment and Plan / UC Course  I have reviewed the triage vital signs and the nursing notes.  Pertinent labs & imaging results that were available during my care of the patient were reviewed by me and considered in my medical decision making (see chart for details).   1. Acute non-recurrent frontal sinusitis (Primary) - amoxicillin-clavulanate (AUGMENTIN) 875-125 MG tablet; Take 1 tablet by mouth 2 (two) times daily for 10 days.  Dispense: 20 tablet; Refill: 0  2. Acute cough - promethazine-dextromethorphan (PROMETHAZINE-DM) 6.25-15 MG/5ML syrup; Take 5 mLs by mouth 4 (four) times daily as needed for cough.  Dispense: 180 mL; Refill: 0    Uncomplicated acute sinusitis with cough.  Return if symptoms do not completely resolved following completion of medication. Final Clinical Impressions(s) / UC Diagnoses   Final diagnoses:  Acute non-recurrent frontal sinusitis  Acute cough     Discharge  Instructions      Complete entire course of antibiotics and Promethazine DM take as needed for cough. Symptoms should gradually improved.  If symptoms have not improved following completion of treatment return for reevaluation.     ED Prescriptions     Medication Sig Dispense Auth. Provider   amoxicillin-clavulanate (AUGMENTIN) 875-125 MG tablet Take 1 tablet by mouth 2 (  two) times daily for 10 days. 20 tablet Buena Carmine, NP   promethazine-dextromethorphan (PROMETHAZINE-DM) 6.25-15 MG/5ML syrup Take 5 mLs by mouth 4 (four) times daily as needed for cough. 180 mL Buena Carmine, NP      PDMP not reviewed this encounter.   Buena Carmine, NP 07/03/23 [phone]

## 2023-07-21 ENCOUNTER — Encounter: Payer: Self-pay | Admitting: Family Medicine

## 2023-07-21 ENCOUNTER — Ambulatory Visit: Admitting: Family Medicine

## 2023-07-21 VITALS — BP 134/68 | HR 95 | Temp 97.8°F | Ht 69.0 in | Wt 261.0 lb

## 2023-07-21 DIAGNOSIS — R062 Wheezing: Secondary | ICD-10-CM | POA: Diagnosis not present

## 2023-07-21 DIAGNOSIS — J208 Acute bronchitis due to other specified organisms: Secondary | ICD-10-CM | POA: Diagnosis not present

## 2023-07-21 MED ORDER — DOXYCYCLINE HYCLATE 100 MG PO TABS: 100.0000 mg | ORAL_TABLET | Freq: Two times a day (BID) | ORAL | 0 refills | Status: DC

## 2023-07-21 MED ORDER — ALBUTEROL SULFATE HFA 108 (90 BASE) MCG/ACT IN AERS: 2.0000 | INHALATION_SPRAY | RESPIRATORY_TRACT | 0 refills | Status: DC | PRN

## 2023-07-21 NOTE — Progress Notes (Signed)
 Andrew Stout T. Keerthi Hazell, MD, CAQ Sports Medicine [facility] at [facility] [address] [place], [address]  Phone: [phone]  FAX: [phone]  Andrew Stout - 58 y.o. male  MRN [id number]  Date of Birth: 02/25/1966  Date: 07/21/2023  PCP: Scherrie Curt, MD  Referral: Scherrie Curt, MD  Chief Complaint  Patient presents with   Cough    Treated for sinus infection from UC on 4/17 given Augmentin  and promethazine .  Finished Abx was feeling better, but cough and slight nasal congestion didn't not resolve and cough is worsening.    Subjective:   Andrew Stout is a 58 y.o. very pleasant male patient with Body mass index is 38.54 kg/m. who presents with the following:  Sinusitis a few weeka ago -he was treated with the Augmentin  and some cough syrup at that time.  Did develop some fairly severe burning diarrhea, but he did complete his antibiotics.  Since then, pressure in the sinuses has continued to improve but now he has a deep cough with it is at times productive.  He does think that the cough is worsening.  Did get better and now with a pain in the chest Stuffiness is gone down  Flonase  -   He does not have a history of any known pulmonary disease and he is a non-smoker.  Review of Systems is noted in the HPI, as appropriate  Objective:   BP 134/68   Pulse 95   Temp 97.8 F (36.6 C) (Temporal)   Ht 5\' 9"  (1.753 m)   Wt 261 lb (118.4 kg)   SpO2 98%   BMI 38.54 kg/m    Gen: WDWN, NAD. Globally Non-toxic HEENT: Throat clear, w/o exudate, R TM clear, L TM - good landmarks, No fluid present. rhinnorhea.  MMM Frontal sinuses: NT Max sinuses: NT NECK: Anterior cervical  LAD is absent CV: RRR, No M/G/R, cap refill <2 sec PULM: Breathing comfortably in no respiratory distress.  There is no crackles. Mild wheezing and some rhonchi in the right lung fields  Laboratory and Imaging Data:  Assessment and Plan:      ICD-10-CM   1. Acute bronchitis due to other specified organisms  J20.8     2. Wheezing  R06.2      This point concerned that this is developing into bronchitis or pneumonia, and he also has some wheezing, so we will give him some albuterol to use as needed.  He does not promptly recover, we could think about putting him on some steroids, too.  Medication Management during today's office visit: Meds ordered this encounter  Medications   doxycycline (VIBRA-TABS) 100 MG tablet    Sig: Take 1 tablet (100 mg total) by mouth 2 (two) times daily.    Dispense:  20 tablet    Refill:  0   albuterol (VENTOLIN HFA) 108 (90 Base) MCG/ACT inhaler    Sig: Inhale 2 puffs into the lungs every 4 (four) hours as needed for wheezing or shortness of breath.    Dispense:  1 each    Refill:  0   There are no discontinued medications.  Orders placed today for conditions managed today: No orders of the defined types were placed in this encounter.   Disposition: No follow-ups on file.  [employer] One speech-to-text software was used for transcription in this dictation.  Possible transcriptional errors can occur using Animal nutritionist.   Signed,  Ranny Bye. Zachari Alberta, MD   Outpatient  Encounter Medications as of 07/21/2023  Medication Sig   albuterol (VENTOLIN HFA) 108 (90 Base) MCG/ACT inhaler Inhale 2 puffs into the lungs every 4 (four) hours as needed for wheezing or shortness of breath.   cyclobenzaprine  (FLEXERIL ) 10 MG tablet TAKE 1 TABLET(10 MG) BY MOUTH AT BEDTIME AS NEEDED FOR MUSCLE SPASMS   doxycycline (VIBRA-TABS) 100 MG tablet Take 1 tablet (100 mg total) by mouth 2 (two) times daily.   fluticasone  (FLONASE ) 50 MCG/ACT nasal spray Place 2 sprays into both nostrils daily as needed for allergies or rhinitis.   losartan  (COZAAR ) 100 MG tablet Take 1 tablet (100 mg total) by mouth daily.   Multiple Vitamin (MULTIVITAMIN) tablet Take 1 tablet by mouth daily.   promethazine -dextromethorphan  (PROMETHAZINE -DM) 6.25-15 MG/5ML syrup Take 5 mLs by mouth 4 (four) times daily as needed for cough.   scopolamine  (TRANSDERM-SCOP) 1 MG/3DAYS Place 1 patch (1.5 mg total) onto the skin every 3 (three) days.   No facility-administered encounter medications on file as of 07/21/2023.

## 2023-10-04 ENCOUNTER — Ambulatory Visit
Admission: EM | Admit: 2023-10-04 | Discharge: 2023-10-04 | Disposition: A | Discharge status: Home / Self Care | Attending: Emergency Medicine | Admitting: Emergency Medicine

## 2023-10-04 DIAGNOSIS — H6501 Acute serous otitis media, right ear: Secondary | ICD-10-CM

## 2023-10-04 DIAGNOSIS — J019 Acute sinusitis, unspecified: Secondary | ICD-10-CM

## 2023-10-04 MED ORDER — PREDNISONE 10 MG (21) PO TBPK: ORAL_TABLET | Freq: Every day | ORAL | 0 refills | Status: DC

## 2023-10-04 MED ORDER — MECLIZINE HCL 12.5 MG PO TABS: 12.5000 mg | ORAL_TABLET | Freq: Three times a day (TID) | ORAL | 0 refills | Status: AC | PRN

## 2023-10-04 MED ORDER — CEFDINIR 300 MG PO CAPS: 300.0000 mg | ORAL_CAPSULE | Freq: Two times a day (BID) | ORAL | 0 refills | Status: AC

## 2023-10-04 NOTE — ED Provider Notes (Signed)
 [facility]    CSN: [id number] Arrival date & time: 10/04/23  1034      History   Chief Complaint Chief Complaint  Patient presents with   Cough   Ear Fullness    HPI Andrew Stout is a 58 y.o. male.   Patient presents for evaluation of subjective fever, nasal congestion, productive cough, sinus pressure to the posterior head beginning 1 day ago.  This morning began to experience right-sided ear fullness and dizziness described as the room spinning.  Has attempted use of Zyrtec and Flonase  which has been somewhat helpful.  Tolerating food and liquids.  No known sick contacts prior.  Denies shortness of breath or wheezing.  Past Medical History:  Diagnosis Date   Allergy    seasonal   Chronic kidney disease    one kidney   Hypertension    Solitary kidney, congenital 05/10/2015    Patient Active Problem List   Diagnosis Date Noted   Solitary kidney, congenital 05/10/2015   Right carpal tunnel syndrome 01/01/2013   Essential hypertension 03/07/2009    Past Surgical History:  Procedure Laterality Date   CHEST TUBE INSERTION     collasped lung in high school   NEPHROSTOMY  1975   left   TYMPANOSTOMY TUBE PLACEMENT  2010   right   VASECTOMY         Home Medications    Prior to Admission medications   Medication Sig Start Date End Date Taking? Authorizing Provider  cefdinir  (OMNICEF ) 300 MG capsule Take 1 capsule (300 mg total) by mouth 2 (two) times daily for 7 days. 10/04/23 10/11/23 Yes Christiano Blandon R, NP  losartan  (COZAAR ) 100 MG tablet Take 1 tablet (100 mg total) by mouth daily. 12/23/22  Yes Copland, Jacques, MD  meclizine  (ANTIVERT ) 12.5 MG tablet Take 1 tablet (12.5 mg total) by mouth 3 (three) times daily as needed for dizziness. 10/04/23  Yes Valton Schwartz R, NP  predniSONE  (STERAPRED UNI-PAK 21 TAB) 10 MG (21) TBPK tablet Take by mouth daily. Take 6 tabs by mouth daily  for 1 days, then 5 tabs for 1 days, then 4 tabs for 1 days, then 3  tabs for 1 days, 2 tabs for 1 days, then 1 tab by mouth daily for 1 days 10/04/23  Yes Sammy Douthitt R, NP  albuterol  (VENTOLIN  HFA) 108 (90 Base) MCG/ACT inhaler Inhale 2 puffs into the lungs every 4 (four) hours as needed for wheezing or shortness of breath. 07/21/23   Copland, Jacques, MD  cyclobenzaprine  (FLEXERIL ) 10 MG tablet TAKE 1 TABLET(10 MG) BY MOUTH AT BEDTIME AS NEEDED FOR MUSCLE SPASMS 05/15/23   Copland, Jacques, MD  doxycycline  (VIBRA -TABS) 100 MG tablet Take 1 tablet (100 mg total) by mouth 2 (two) times daily. 07/21/23   Copland, Jacques, MD  fluticasone  (FLONASE ) 50 MCG/ACT nasal spray Place 2 sprays into both nostrils daily as needed for allergies or rhinitis. 12/23/22   Copland, Jacques, MD  Multiple Vitamin (MULTIVITAMIN) tablet Take 1 tablet by mouth daily.    [provider]  promethazine -dextromethorphan (PROMETHAZINE -DM) 6.25-15 MG/5ML syrup Take 5 mLs by mouth 4 (four) times daily as needed for cough. 07/03/23   Arloa Suzen RAMAN, NP  scopolamine  (TRANSDERM-SCOP) 1 MG/3DAYS Place 1 patch (1.5 mg total) onto the skin every 3 (three) days. 05/20/23   CoplandJacques, MD    Family History Family History  Problem Relation Age of Onset   Transient ischemic attack Father     Social History  Social History   Tobacco Use   Smoking status: Never   Smokeless tobacco: Never  Vaping Use   Vaping status: Never Used  Substance Use Topics   Alcohol use: Yes    Comment: rare   Drug use: No     Allergies   Augmentin  [amoxicillin -pot clavulanate]   Review of Systems Review of Systems   Physical Exam Triage Vital Signs ED Triage Vitals  Encounter Vitals Group     BP 10/04/23 1039 (!) 159/97     Girls Systolic BP Percentile --      Girls Diastolic BP Percentile --      Boys Systolic BP Percentile --      Boys Diastolic BP Percentile --      Pulse Rate 10/04/23 1039 92     Resp 10/04/23 1039 19     Temp 10/04/23 1039 100.3 F (37.9 C)     Temp Source  10/04/23 1039 Oral     SpO2 10/04/23 1039 96 %     Weight --      Height --      Head Circumference --      Peak Flow --      Pain Score 10/04/23 1038 0     Pain Loc --      Pain Education --      Exclude from Growth Chart --    No data found.  Updated Vital Signs BP (!) 159/97 (BP Location: Left Arm)   Pulse 92   Temp 100.3 F (37.9 C) (Oral)   Resp 19   SpO2 96%   Visual Acuity Right Eye Distance:   Left Eye Distance:   Bilateral Distance:    Right Eye Near:   Left Eye Near:    Bilateral Near:     Physical Exam Constitutional:      Appearance: Normal appearance.  HENT:     Head: Normocephalic.     Right Ear: Ear canal and external ear normal. Tympanic membrane is erythematous.     Left Ear: Tympanic membrane, ear canal and external ear normal.     Nose: Congestion present.     Mouth/Throat:     Mouth: Mucous membranes are moist.     Pharynx: Oropharynx is clear.  Eyes:     Extraocular Movements: Extraocular movements intact.  Cardiovascular:     Rate and Rhythm: Normal rate and regular rhythm.     Pulses: Normal pulses.     Heart sounds: Normal heart sounds.  Pulmonary:     Effort: Pulmonary effort is normal.     Breath sounds: Normal breath sounds.  Neurological:     Mental Status: He is alert and oriented to person, place, and time. Mental status is at baseline.      UC Treatments / Results  Labs (all labs ordered are listed, but only abnormal results are displayed) Labs Reviewed - No data to display  EKG   Radiology No results found.  Procedures Procedures (including critical care time)  Medications Ordered in UC Medications - No data to display  Initial Impression / Assessment and Plan / UC Course  I have reviewed the triage vital signs and the nursing notes.  Pertinent labs & imaging results that were available during my care of the patient were reviewed by me and considered in my medical decision making (see chart for  details).  Nonrecurrent acute serous otitis media of the right ear, acute nonrecurrent sinusitis  Low-grade fever of 100.3 noted in triage,  remaining vital signs stable, erythema present to the right tympanic membrane Is tenderness throughout the forehead and the bilateral cheeks, will initiate antibiotics, prescribed cefdinir  and prescribed prednisone  and meclizine  for management of sinus pressure and dizziness, recommended over-the-counter medications and nonpharmacological supportive measures, advised follow-up as needed Final Clinical Impressions(s) / UC Diagnoses   Final diagnoses:  Non-recurrent acute serous otitis media of right ear  Acute non-recurrent sinusitis, unspecified location     Discharge Instructions      You are being treated for a right-sided ear infection and sinus infection  Take cefdinir  twice daily for 7 days for treatment of the ear infection  Begin prednisone  every morning with food as directed to reduce sinus pressure  For dizziness you may use meclizine  every 8 hours as needed,  While dizziness is present change positions slowly and if dizziness is severe you yourself to a seated position to prevent injury    You can take Tylenol  and/or Ibuprofen  as needed for fever reduction and pain relief.   For cough: honey 1/2 to 1 teaspoon (you can dilute the honey in water or another fluid).  You can also use guaifenesin and dextromethorphan for cough. You can use a humidifier for chest congestion and cough.  If you don't have a humidifier, you can sit in the bathroom with the hot shower running.      For sore throat: try warm salt water gargles, cepacol lozenges, throat spray, warm tea or water with lemon/honey, popsicles or ice, or OTC cold relief medicine for throat discomfort.   For congestion: take a daily anti-histamine like Zyrtec, Claritin, and a oral decongestant, such as pseudoephedrine.  You can also use Flonase  1-2 sprays in each nostril daily.   It is  important to stay hydrated: drink plenty of fluids (water, gatorade/powerade/pedialyte, juices, or teas) to keep your throat moisturized and help further relieve irritation/discomfort.    ED Prescriptions     Medication Sig Dispense Auth. Provider   cefdinir  (OMNICEF ) 300 MG capsule Take 1 capsule (300 mg total) by mouth 2 (two) times daily for 7 days. 14 capsule Alzora Ha R, NP   predniSONE  (STERAPRED UNI-PAK 21 TAB) 10 MG (21) TBPK tablet Take by mouth daily. Take 6 tabs by mouth daily  for 1 days, then 5 tabs for 1 days, then 4 tabs for 1 days, then 3 tabs for 1 days, 2 tabs for 1 days, then 1 tab by mouth daily for 1 days 21 tablet Gilmore List R, NP   meclizine  (ANTIVERT ) 12.5 MG tablet Take 1 tablet (12.5 mg total) by mouth 3 (three) times daily as needed for dizziness. 30 tablet Lenell Lama R, NP      PDMP not reviewed this encounter.   Teresa Shelba SAUNDERS, NP 10/04/23 [phone]

## 2023-10-04 NOTE — Discharge Instructions (Signed)
 You are being treated for a right-sided ear infection and sinus infection  Take cefdinir  twice daily for 7 days for treatment of the ear infection  Begin prednisone  every morning with food as directed to reduce sinus pressure  For dizziness you may use meclizine  every 8 hours as needed,  While dizziness is present change positions slowly and if dizziness is severe you yourself to a seated position to prevent injury    You can take Tylenol  and/or Ibuprofen  as needed for fever reduction and pain relief.   For cough: honey 1/2 to 1 teaspoon (you can dilute the honey in water or another fluid).  You can also use guaifenesin and dextromethorphan for cough. You can use a humidifier for chest congestion and cough.  If you don't have a humidifier, you can sit in the bathroom with the hot shower running.      For sore throat: try warm salt water gargles, cepacol lozenges, throat spray, warm tea or water with lemon/honey, popsicles or ice, or OTC cold relief medicine for throat discomfort.   For congestion: take a daily anti-histamine like Zyrtec, Claritin, and a oral decongestant, such as pseudoephedrine.  You can also use Flonase  1-2 sprays in each nostril daily.   It is important to stay hydrated: drink plenty of fluids (water, gatorade/powerade/pedialyte, juices, or teas) to keep your throat moisturized and help further relieve irritation/discomfort.

## 2023-10-04 NOTE — ED Triage Notes (Signed)
 Sx started yesterday  Cough Runny nose Sinus pressure  Woke up this morning with dizziness and stuffy ears.   Took zyrtec and Flonase .

## 2023-10-10 ENCOUNTER — Emergency Department (HOSPITAL_COMMUNITY)
Admission: EM | Admit: 2023-10-10 | Discharge: 2023-10-10 | Disposition: A | Discharge status: Home / Self Care | Attending: Emergency Medicine | Admitting: Emergency Medicine

## 2023-10-10 ENCOUNTER — Encounter (HOSPITAL_COMMUNITY): Payer: Self-pay

## 2023-10-10 ENCOUNTER — Other Ambulatory Visit: Payer: Self-pay

## 2023-10-10 DIAGNOSIS — S50812A Abrasion of left forearm, initial encounter: Secondary | ICD-10-CM | POA: Diagnosis not present

## 2023-10-10 DIAGNOSIS — Z79899 Other long term (current) drug therapy: Secondary | ICD-10-CM | POA: Diagnosis not present

## 2023-10-10 DIAGNOSIS — Y92410 Unspecified street and highway as the place of occurrence of the external cause: Secondary | ICD-10-CM | POA: Diagnosis not present

## 2023-10-10 DIAGNOSIS — N189 Chronic kidney disease, unspecified: Secondary | ICD-10-CM | POA: Insufficient documentation

## 2023-10-10 DIAGNOSIS — S59912A Unspecified injury of left forearm, initial encounter: Secondary | ICD-10-CM | POA: Diagnosis not present

## 2023-10-10 DIAGNOSIS — I129 Hypertensive chronic kidney disease with stage 1 through stage 4 chronic kidney disease, or unspecified chronic kidney disease: Secondary | ICD-10-CM | POA: Diagnosis not present

## 2023-10-10 NOTE — ED Notes (Signed)
 Discharge instructions reviewed.   Opportunity for questions and concerns provided.   Alert, oriented and ambulatory. Displays no signs of distress.
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
## 2023-10-10 NOTE — Discharge Instructions (Signed)
 Evaluation today was overall reassuring.  Please follow-up with your PCP.  If you develop chest pain, abdominal pain, headache visual disturbance, weakness or numbness in your extremities or any other concerning symptom please return to the ED for further evaluation.
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
## 2023-10-13 ENCOUNTER — Ambulatory Visit: Payer: Self-pay

## 2023-10-13 MED ORDER — FLUCONAZOLE 150 MG PO TABS: 150.0000 mg | ORAL_TABLET | Freq: Once | ORAL | 0 refills | Status: AC

## 2023-10-13 NOTE — Addendum Note (Signed)
 Addended by: WENDELL ARLAND RAMAN on: 10/13/2023 12:33 PM   Modules accepted: Orders

## 2023-10-13 NOTE — Telephone Encounter (Signed)
 Answer Assessment - Initial Assessment Questions Patient called in asking if PCP can call him in medication for yeast infection. Patient just finished a 7 day course of antibiotics and notices itching in perineum, anal itching and soreness, and foul smell in perineum. Patient states this has happened in the past while on antibiotics and he was prescribed a medication to assist. Patient states he would like  a call back if it is required for him to come into office to be seen. If he can have mediation called in, please call in to [facility] on pharmacy profile.  Protocols used: No Guideline Available-A-AH

## 2023-10-13 NOTE — Telephone Encounter (Signed)
 Rx for Diflucan  sent to [facility] as instructed by Dr. Watt.  Left message for Delshawn that prescription has been sent to his pharmacy as requested.

## 2023-10-13 NOTE — Telephone Encounter (Signed)
 FYI Only or Action Required?: Action required by provider: medication refill request. - see triage notes  Patient was last seen in primary care on 07/21/2023 by Watt Mirza, MD.  Called Nurse Triage reporting yeast infection  Symptoms began several days ago.  Interventions attempted: Nothing.  Symptoms are: gradually worsening.  Triage Disposition: Call PCP Within 24 Hours  Patient/caregiver understands and will follow disposition?:

## 2023-10-13 NOTE — Telephone Encounter (Addendum)
 First attempt; left vm. Second attempt; no answer  Copied from CRM [id number]. Topic: Clinical - Medication Question >> Oct 13, 2023  8:31 AM Andrew Stout wrote: Patient believes the antibiotic from his ear infection caused current yeast infection and would like medication sent to pharmacy on file for yeast infection. His number is [phone] (M)

## 2023-10-21 ENCOUNTER — Ambulatory Visit: Payer: Self-pay

## 2023-10-21 NOTE — Telephone Encounter (Signed)
 Appointment scheduled with Dr. Watt 10/22/23 at 3:20 pm
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
## 2023-10-22 ENCOUNTER — Ambulatory Visit: Admitting: Family Medicine
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
## 2023-10-29 ENCOUNTER — Ambulatory Visit: Admitting: Family Medicine

## 2023-10-29 ENCOUNTER — Encounter: Payer: Self-pay | Admitting: Family Medicine

## 2023-10-29 ENCOUNTER — Ambulatory Visit
Admission: RE | Admit: 2023-10-29 | Discharge: 2023-10-29 | Disposition: A | Discharge status: Home / Self Care | Source: Ambulatory Visit | Attending: Family Medicine | Admitting: Family Medicine

## 2023-10-29 VITALS — BP 120/80 | HR 74 | Temp 98.0°F | Ht 69.0 in | Wt 260.1 lb

## 2023-10-29 DIAGNOSIS — M545 Low back pain, unspecified: Secondary | ICD-10-CM | POA: Diagnosis not present

## 2023-10-29 DIAGNOSIS — M542 Cervicalgia: Secondary | ICD-10-CM | POA: Diagnosis not present

## 2023-10-29 DIAGNOSIS — S060X0A Concussion without loss of consciousness, initial encounter: Secondary | ICD-10-CM

## 2023-10-29 MED ORDER — CYCLOBENZAPRINE HCL 10 MG PO TABS: 10.0000 mg | ORAL_TABLET | Freq: Every evening | ORAL | 1 refills | Status: AC | PRN

## 2023-10-29 NOTE — Patient Instructions (Addendum)
 Clotrimazole cream OTC, twice a day to groin   Mild Traumatic Brain Injury (CONCUSSSION):  Andrew Schroeder, MD, [facility] Adapted from [facility], [employer], and [employer]  Think of the human brain almost as an egg yolk and your skull as an egg shell. When your head or body takes a hit, it can cause your brain to shake around inside your skull, injuring the brain. A concussion is not only caused by a hit to the head, but can also be caused by an impact to the body that ends up shaking your brain in your skull, such as whiplash.  A common misconception about concussion is that one loses consciousness. However, loss of consciousness occurs in only less than 10% of concussion cases.  Concussive symptoms typically resolve in 7 to 10 days (sports-related concussions) or within 3 months (non-athletes).  Approximately 20% of people have symptoms after 6 weeks.  With concussions, we have learned that it generally takes youth longer to recover from concussions. Another thing that we now know about concussions is that it usually takes male athletes longer to recover than male athletes.  No two concussions are identical. In fact, there are at least six different clinical trajectories that concussions may take.  Signs and Symptoms of Concussion:  The signs and symptoms of a concussion are incredibly important because a concussion doesn't show up on imaging like an x-ray, CT, or MRI scan and there is no objective test, like a blood or saliva test, that can determine if a patient has a concussion. A doctor makes a concussion diagnosis based on the results of a comprehensive examination, which includes observing signs of concussion and patients reporting symptoms of concussion appearing after an impact to the head or body. Concussion signs and symptoms are the brain's way of showing it is injured and not functioning normally.  CONCUSSION SIGNS  Concussion signs  are what someone could observe about you to determine if you have a concussion.   Common concussion signs include:  Loss of consciousness Problems with balance Glazed look in the eyes Amnesia Delayed response to questions Forgetting an instruction, confusion about an assignment or position, or  confusion of the game, score, or opponent Inappropriate crying Inappropriate laughter Vomiting  CONCUSSION SYMPTOMS  Concussion symptoms are what someone who is concussed will tell you that they are experiencing. Concussion symptoms typically fall into six major categories:  1- Somatic (Physical) Symptoms  Headache Light-headedness Dizziness Nausea Sensitivity to light Sensitivity to noise  2- Cognitive Symptoms  Difficulties with attention Memory problems Loss of focus Difficulty multitasking Difficulty completing mental tasks  3- Sleep Symptoms  Sleeping more than usual Sleeping less than usual Having trouble falling asleep  4- Emotional Symptoms  Anxiety Depression Panic attacks  5 - Vestibular Symptoms  The vestibular system is affected in nearly 60% of youth and adolescent athletes following a concussion.  But what is the vestibular system?  It's the sensory system that helps with your sense of balance and spatial orientation. Think of a gyroscope! With help from your inner ears, your vestibular system detects the motion or position of your head in space. It sends information to your brain that's needed for balance and stable vision.  Need an example? If you're moving and looking at moving objects at the same time (think riding in a car), you're able to stay focused and not lose visual clarity. During a vestibular concussion, your gyroscope isn't working at full potential.  Difficulty with  balance Dizziness. It may feel like the room is spinning or a slow, wavy sensation. (Like you're on a boat!) Trouble stabilizing vision when moving your head. (We call this  Vestibular-Ocular Reflux, or VOR.) Think back to riding in a car. With a vestibular concussion, you can't stay focused. (The technical name for this is Visual Motion Sensitivity, or VMS.) Triggers  These 3 things could bring on vestibular symptoms:  Dynamic movements Busy environments, like the grocery store Crowds  6 - Oculo-motor  A concussion that affects the ocular, or visual, system of the brain. Typically, patients with ocular-motor concussions report pressure headaches in the front of their head, feeling more tired than normal, and becoming more symptomatic doing math or science exercises at school.  Patients also experience difficulties with their eyes working together. Follow along to explore some of the most common symptoms.  Convergence  The eyes converge when viewing objects up close, such as with reading. With convergence problems, patients may see a double image as a target moves closer to them. Typically, without a concussion, objects can be brought very close without doubling.  Accommodation  Accommodation problems cause an object to become blurry as it is viewed up close. Accommodative and convergence problems are often experienced together and can impact reading and other near-vision activities.  Pursuits and Saccades  The eyes use pursuit eye movements to follow objects; while saccade eyes movements allow the eyes to shift rapidly from one object to another. Tracking objects, reading a book, scrolling on a computer or even watching for a moving car while crossing the street can be difficult when patients have problems with pursuit and saccade eye movements.  Misalignment  When people with eye misalignment (one eye drifts, eyes aren't perfectly aligned) sustain a concussion, the brain may have difficulty compensating for the misalignment like it once did. This can result in blurry vision, difficulty taking notes in class and focusing on the chalkboard.  Note: This  is not an exhaustive list of concussion signs and symptoms, and it may take a few days for concussion symptoms to appear after the initial injury.  TREATMENT:  THE MOST IMPORTANT THING IS REST AS SOON AS POSSIBLE AFTER INJURY SO THAT THE BRAIN CAN RECOVER. COMPLETE PHYSICAL AND MENTAL REST ARE NEEDED INITIALLY.   THAT MEANS: NO SCHOOL OR WORK FOR AT LEAST 3 DAYS AND CLEARED BY YOUR DOCTOR NO MENTAL EXERTION, MEANING NO WORK, NO HOMEWORK, NO TEST TAKING.  Avoid situations with loud noise, bright lights, or crowds. However, this doesn't mean isolate yourself in a dark room for a week. Too much isolation and boredom can be harmful, contributing to feelings of anxiety, depression, and resulting in increased recovery time. Spend time with friends and family, but monitor your symptoms and avoid situations that make you feel worse.   NO VIDEO GAMES, NO USING THE COMPUTER, NO TEXTING, NO USING SMARTPHONES, NO USE OF AN IPAD OR TABLET. DO NOT GO TO A MOVIE THEATRE OR WATCH SPORTS ON TV. HDTV TENDS TO MAKE PEOPLE FEEL WORSE.   ON APPROXIMATELY DAY 4, BEGIN VERY LIGHT EXERCISE SUCH AS WALKING. DO NOT START ANY STRENUOUS EXERCISE.   You will be given return to school or return to work recommendations.
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
## 2023-11-12 ENCOUNTER — Encounter: Payer: Self-pay | Admitting: Family Medicine

## 2023-11-12 ENCOUNTER — Ambulatory Visit: Admitting: Family Medicine

## 2023-11-12 VITALS — BP 138/90 | HR 70 | Temp 98.0°F | Ht 69.0 in | Wt 262.5 lb

## 2023-11-12 DIAGNOSIS — M542 Cervicalgia: Secondary | ICD-10-CM

## 2023-11-12 DIAGNOSIS — S060X0D Concussion without loss of consciousness, subsequent encounter: Secondary | ICD-10-CM | POA: Diagnosis not present

## 2023-11-12 DIAGNOSIS — M545 Low back pain, unspecified: Secondary | ICD-10-CM

## 2023-11-12 NOTE — Patient Instructions (Signed)
 Fl-41 GLASSES

## 2023-11-13 ENCOUNTER — Ambulatory Visit: Payer: Self-pay | Admitting: Family Medicine

## 2023-11-24 ENCOUNTER — Encounter: Payer: Self-pay | Admitting: Physical Therapy

## 2023-11-24 ENCOUNTER — Other Ambulatory Visit: Payer: Self-pay

## 2023-11-24 ENCOUNTER — Ambulatory Visit: Attending: Family Medicine | Admitting: Physical Therapy

## 2023-11-24 DIAGNOSIS — M545 Low back pain, unspecified: Secondary | ICD-10-CM | POA: Diagnosis not present

## 2023-11-24 DIAGNOSIS — M542 Cervicalgia: Secondary | ICD-10-CM | POA: Insufficient documentation
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
## 2024-02-03 ENCOUNTER — Other Ambulatory Visit: Payer: Self-pay | Admitting: Family Medicine

## 2024-02-03 NOTE — Telephone Encounter (Signed)
 Called and schedule pt for Cpe / labs

## 2024-02-03 NOTE — Telephone Encounter (Signed)
 Please schedule CPE with fasting labs prior with Dr. Patsy Lager.

## 2024-02-04 ENCOUNTER — Other Ambulatory Visit: Payer: Self-pay | Admitting: Family Medicine

## 2024-02-04 DIAGNOSIS — Z125 Encounter for screening for malignant neoplasm of prostate: Secondary | ICD-10-CM

## 2024-02-04 DIAGNOSIS — Z131 Encounter for screening for diabetes mellitus: Secondary | ICD-10-CM

## 2024-02-04 DIAGNOSIS — Z79899 Other long term (current) drug therapy: Secondary | ICD-10-CM

## 2024-02-04 DIAGNOSIS — E785 Hyperlipidemia, unspecified: Secondary | ICD-10-CM

## 2024-02-16 ENCOUNTER — Other Ambulatory Visit (INDEPENDENT_AMBULATORY_CARE_PROVIDER_SITE_OTHER)

## 2024-02-16 DIAGNOSIS — E785 Hyperlipidemia, unspecified: Secondary | ICD-10-CM

## 2024-02-16 DIAGNOSIS — Z131 Encounter for screening for diabetes mellitus: Secondary | ICD-10-CM | POA: Diagnosis not present

## 2024-02-16 DIAGNOSIS — Z79899 Other long term (current) drug therapy: Secondary | ICD-10-CM

## 2024-02-16 DIAGNOSIS — Z125 Encounter for screening for malignant neoplasm of prostate: Secondary | ICD-10-CM

## 2024-02-16 LAB — LIPID PANEL
Cholesterol: 219 mg/dL — ABNORMAL HIGH (ref 0–200)
HDL: 45.4 mg/dL (ref >39.00)
LDL Cholesterol: 154 mg/dL — ABNORMAL HIGH (ref 0–99)
NonHDL: 173.22
Total CHOL/HDL Ratio: 5
Triglycerides: 96 mg/dL (ref 0.0–149.0)
VLDL: 19.2 mg/dL (ref 0.0–40.0)

## 2024-02-16 LAB — HEMOGLOBIN A1C: Hgb A1c MFr Bld: 5.8 % (ref 4.6–6.5)

## 2024-02-16 LAB — BASIC METABOLIC PANEL WITH GFR
BUN: 19 mg/dL (ref 6–23)
CO2: 29 meq/L (ref 19–32)
Calcium: 9.3 mg/dL (ref 8.4–10.5)
Chloride: 103 meq/L (ref 96–112)
Creatinine, Ser: 1.36 mg/dL (ref 0.40–1.50)
GFR: 57.69 mL/min — ABNORMAL LOW (ref >60.00)
Glucose, Bld: 101 mg/dL — ABNORMAL HIGH (ref 70–99)
Potassium: 4 meq/L (ref 3.5–5.1)
Sodium: 140 meq/L (ref 135–145)

## 2024-02-16 LAB — HEPATIC FUNCTION PANEL
ALT: 18 U/L (ref 0–53)
AST: 25 U/L (ref 0–37)
Albumin: 4.3 g/dL (ref 3.5–5.2)
Alkaline Phosphatase: 54 U/L (ref 39–117)
Bilirubin, Direct: 0.2 mg/dL (ref 0.0–0.3)
Total Bilirubin: 1.2 mg/dL (ref 0.2–1.2)
Total Protein: 6.8 g/dL (ref 6.0–8.3)

## 2024-02-16 LAB — CBC WITH DIFFERENTIAL/PLATELET
Basophils Absolute: 0 K/uL (ref 0.0–0.1)
Basophils Relative: 0.6 % (ref 0.0–3.0)
Eosinophils Absolute: 0.2 K/uL (ref 0.0–0.7)
Eosinophils Relative: 3 % (ref 0.0–5.0)
HCT: 44.6 % (ref 39.0–52.0)
Hemoglobin: 15.1 g/dL (ref 13.0–17.0)
Lymphocytes Relative: 22 % (ref 12.0–46.0)
Lymphs Abs: 1.4 K/uL (ref 0.7–4.0)
MCHC: 33.8 g/dL (ref 30.0–36.0)
MCV: 80.2 fl (ref 78.0–100.0)
Monocytes Absolute: 0.5 K/uL (ref 0.1–1.0)
Monocytes Relative: 8.4 % (ref 3.0–12.0)
Neutro Abs: 4.3 K/uL (ref 1.4–7.7)
Neutrophils Relative %: 66 % (ref 43.0–77.0)
Platelets: 255 K/uL (ref 150.0–400.0)
RBC: 5.56 Mil/uL (ref 4.22–5.81)
RDW: 14.4 % (ref 11.5–15.5)
WBC: 6.5 K/uL (ref 4.0–10.5)

## 2024-02-18 LAB — PSA, TOTAL WITH REFLEX TO PSA, FREE: PSA, Total: 0.2 ng/mL (ref <=4.0)
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
## 2024-02-23 ENCOUNTER — Encounter: Payer: Self-pay | Admitting: Family Medicine

## 2024-02-23 ENCOUNTER — Ambulatory Visit: Admitting: Family Medicine

## 2024-02-23 VITALS — BP 140/98 | HR 75 | Temp 99.0°F | Ht 69.25 in | Wt 267.2 lb

## 2024-02-23 DIAGNOSIS — E782 Mixed hyperlipidemia: Secondary | ICD-10-CM | POA: Insufficient documentation

## 2024-02-23 DIAGNOSIS — Z0000 Encounter for general adult medical examination without abnormal findings: Secondary | ICD-10-CM

## 2024-02-23 DIAGNOSIS — N1831 Chronic kidney disease, stage 3a: Secondary | ICD-10-CM | POA: Insufficient documentation

## 2024-02-25 IMAGING — DX DG LUMBAR SPINE COMPLETE 4+V
5 series · 5 of 5 positions shown · non-contrast
Comparison: None.

CLINICAL DATA: Left leg radiculopathy

EXAM:
LUMBAR SPINE - COMPLETE 4+ VIEW

[lumbar spine ap]
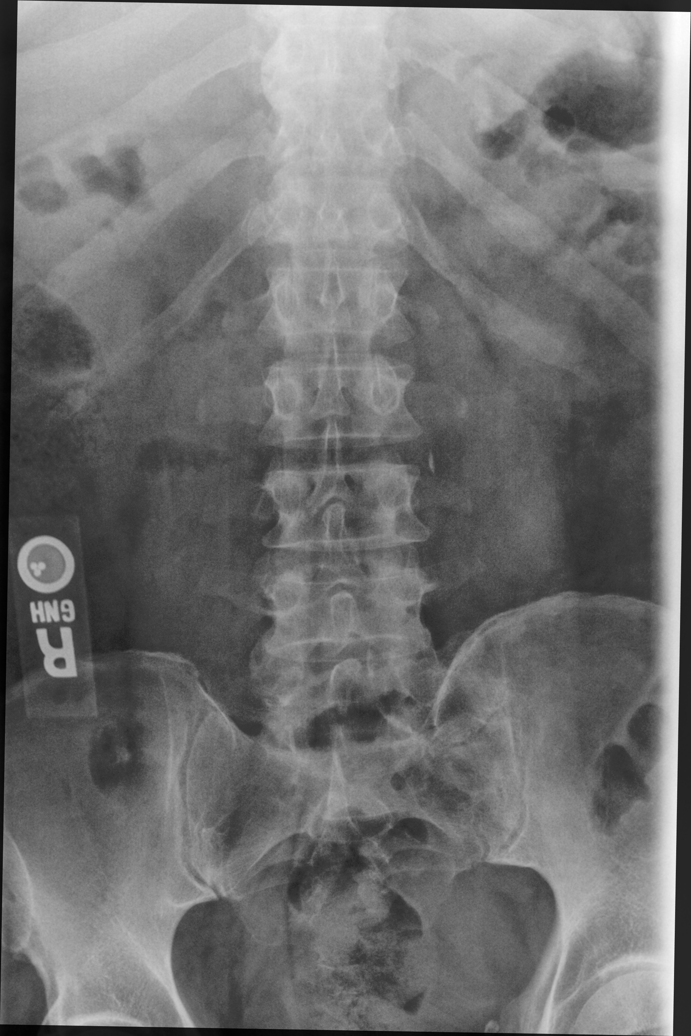

[lumbar spine mlo (1 of 2)]
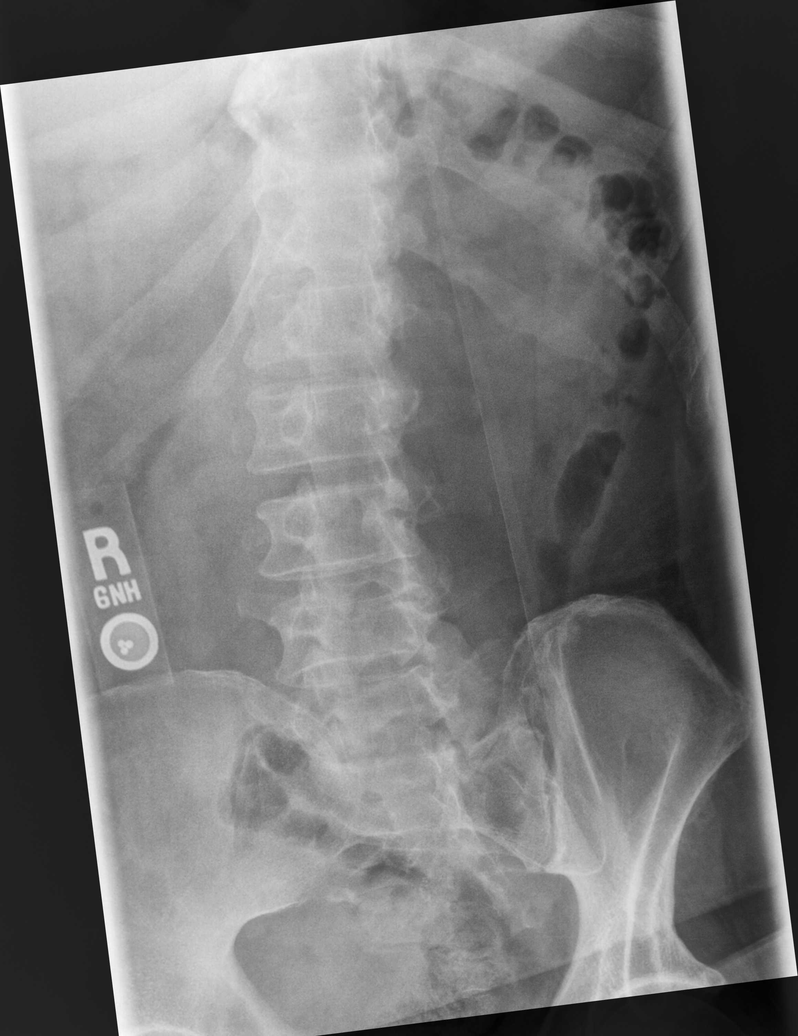

[lumbar spine mlo (2 of 2)]
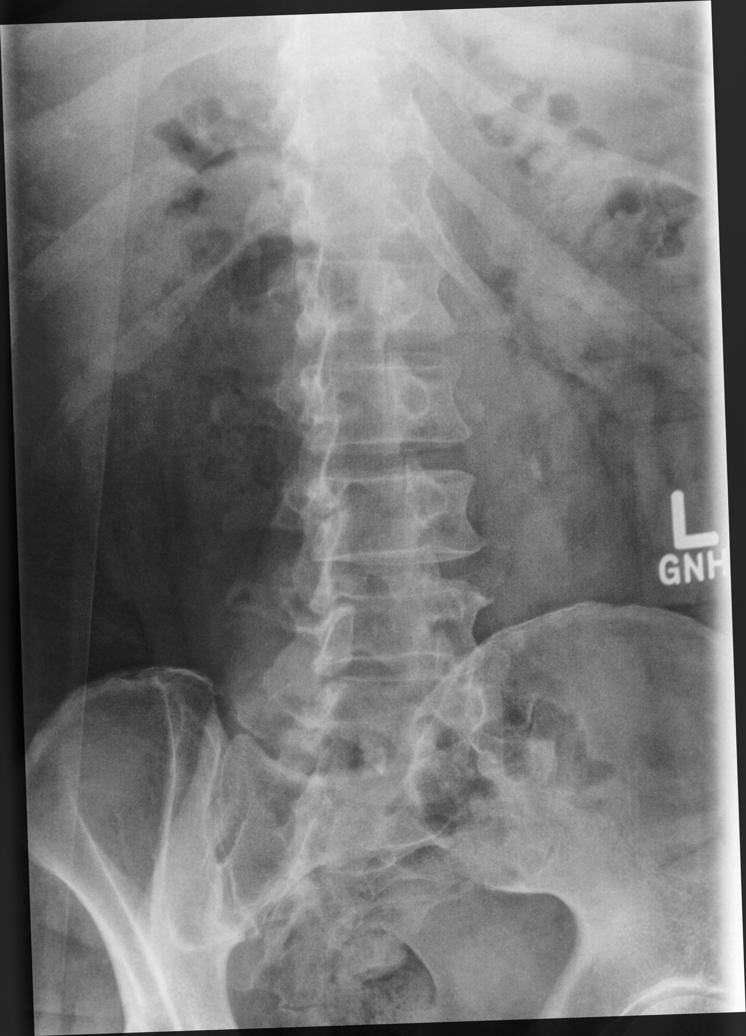

[lumbar spine lat (1 of 2)]
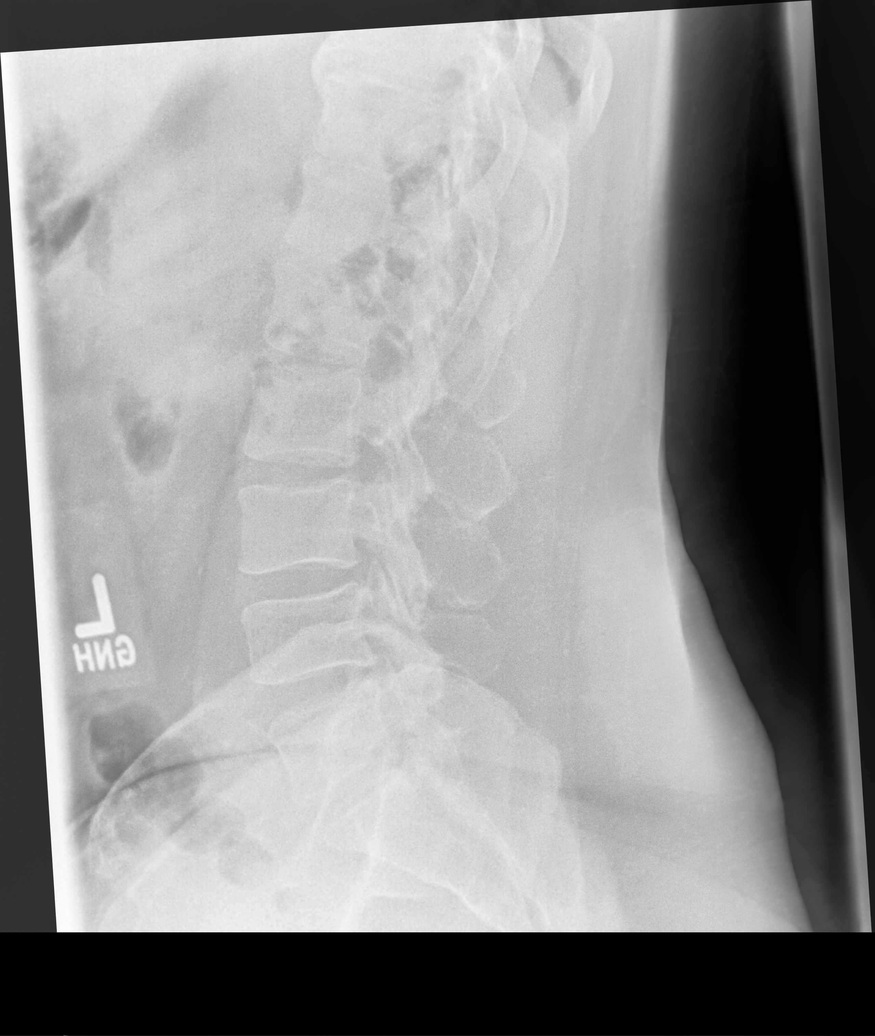

[lumbar spine lat (2 of 2)]
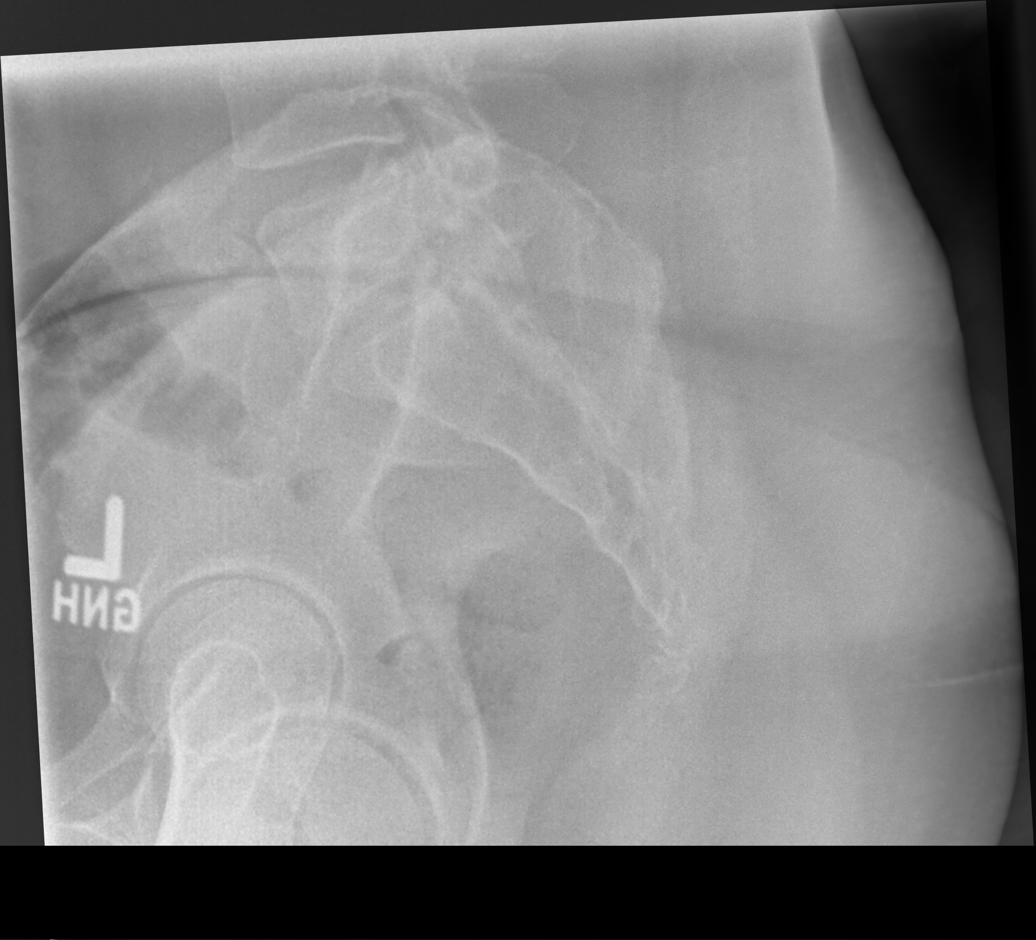

[5 of 5 positions shown; findings below may reference images not displayed]

FINDINGS: Partial sacralization L5. There is no evidence of lumbar spine
fracture. Alignment is normal. Intervertebral disc spaces are
maintained. Minimal atherosclerotic calcification within the
abdominal aorta.
IMPRESSION: No acute fracture or listhesis.

## 2024-03-15 ENCOUNTER — Ambulatory Visit: Payer: Self-pay

## 2024-03-15 NOTE — Telephone Encounter (Signed)
" °  FYI Only or Action Required?: FYI only for provider: home care advised.  Patient was last seen in primary care on 02/23/2024 by Watt Mirza, MD.  Called Nurse Triage reporting Abdominal Pain, Nasal Congestion, Cough, and Sore Throat.  Symptoms began several days ago.  Interventions attempted: OTC medications: zyrtec, mucinex.  Symptoms are: gradually improving.  Triage Disposition: Home Care  Patient/caregiver understands and will follow disposition?: Yes   Copied from CRM #[id number]. Topic: Clinical - Red Word Triage >> Mar 15, 2024 11:54 AM Ber[place] MATSU wrote: Red Word that prompted transfer to Nurse Triage: pain in stomach tightness ear and throat congestion, headache is so bad tolerate, OTC working but not getting better, cough is bad and neck pain. Patient believes it may be sinus but is unsure. Reason for Disposition  Common cold with no complications  Answer Assessment - Initial Assessment Questions 1. ONSET: When did the nasal discharge start?      Wednesday   3. COUGH: Do you have a cough? If Yes, ask: Describe the color of your mucus. (e.g., clear, white, yellow, green)     Yes but it is improving   4. RESPIRATORY DISTRESS: Describe your breathing.      Pt states I am breathing pretty good but I hear a rattling at night. Pt speaking in multiple full sentences at a time over the telephone.   5. FEVER: Do you have a fever? If Yes, ask: What is your temperature, how was it measured, and when did it start?     Denies  6. SEVERITY: Overall, how bad are you feeling right now? (e.g., doesn't interfere with normal activities, staying home from school/work, staying in bed)      I feel better today  7. OTHER SYMPTOMS: Do you have any other symptoms? (e.g., earache, mouth sores, sore throat, wheezing) Nasal congestion, cough, HA, ears clogged - symptoms all improving today per patient, stomachache and nausea completely resolved  Zyrtec and  mucinex  Protocols used: Common Cold-A-AH  "

## 2024-03-19 ENCOUNTER — Ambulatory Visit
Admission: RE | Admit: 2024-03-19 | Discharge: 2024-03-19 | Disposition: A | Discharge status: Home / Self Care | Source: Ambulatory Visit | Attending: Emergency Medicine | Admitting: Emergency Medicine

## 2024-03-19 VITALS — BP 149/94 | HR 79 | Temp 98.5°F | Resp 19

## 2024-03-19 DIAGNOSIS — J209 Acute bronchitis, unspecified: Secondary | ICD-10-CM

## 2024-03-19 DIAGNOSIS — J0110 Acute frontal sinusitis, unspecified: Secondary | ICD-10-CM | POA: Diagnosis not present

## 2024-03-19 MED ORDER — DOXYCYCLINE HYCLATE 100 MG PO CAPS: 100.0000 mg | ORAL_CAPSULE | Freq: Two times a day (BID) | ORAL | 0 refills | Status: AC

## 2024-03-19 MED ORDER — PROMETHAZINE-DM 6.25-15 MG/5ML PO SYRP: 5.0000 mL | ORAL_SOLUTION | Freq: Every evening | ORAL | 0 refills | Status: AC | PRN

## 2024-03-19 MED ORDER — ALBUTEROL SULFATE HFA 108 (90 BASE) MCG/ACT IN AERS: 2.0000 | INHALATION_SPRAY | RESPIRATORY_TRACT | 0 refills | Status: AC | PRN

## 2024-03-19 MED ORDER — BENZONATATE 100 MG PO CAPS: 100.0000 mg | ORAL_CAPSULE | Freq: Three times a day (TID) | ORAL | 0 refills | Status: AC

## 2024-03-19 NOTE — ED Provider Notes (Signed)
 " Andrew Stout    CSN: [id number] Arrival date & time: 03/19/24  1509      History   Chief Complaint Chief Complaint  Patient presents with   Cough    Ear and nasal congestion with sneezing. No fever. Wheezing sounds coming from chest - Entered by patient   Ear Fullness   Headache   Nasal Congestion    HPI Andrew Stout is a 59 y.o. male.   Patient presents for evaluation of chills, nasal congestion , sinus pain and pressure to the forehead, sore throat, nonproductive cough and wheezing with exhalation present for 7 days.  Associated headache.  Cough exacerbates headache and sinus pressure.  Decreased oral intake.  Known sick contact prior.  Has attempted use of Mucinex and Tylenol .       Past Medical History:  Diagnosis Date   Chronic kidney disease, stage 3a (HCC)    one kidney   Hypertension    Mixed hyperlipidemia 02/23/2024   Solitary kidney, congenital 05/10/2015    Patient Active Problem List   Diagnosis Date Noted   CKD stage 3a, GFR 45-59 ml/min (HCC) 02/23/2024   Mixed hyperlipidemia 02/23/2024   Solitary kidney, congenital 05/10/2015   Right carpal tunnel syndrome 01/01/2013   Essential hypertension 03/07/2009    Past Surgical History:  Procedure Laterality Date   CHEST TUBE INSERTION     collasped lung in high school   NEPHROSTOMY  1975   left   TYMPANOSTOMY TUBE PLACEMENT  2010   right   VASECTOMY         Home Medications    Prior to Admission medications  Medication Sig Start Date End Date Taking? Authorizing Provider  albuterol  (VENTOLIN  HFA) 108 (90 Base) MCG/ACT inhaler Inhale 2 puffs into the lungs every 4 (four) hours as needed for wheezing or shortness of breath. 07/21/23   Copland, Jacques, MD  cyclobenzaprine  (FLEXERIL ) 10 MG tablet Take 1 tablet (10 mg total) by mouth at bedtime as needed for muscle spasms. 10/29/23   Copland, Jacques, MD  fluticasone  (FLONASE ) 50 MCG/ACT nasal spray Place 2 sprays into both nostrils  daily as needed for allergies or rhinitis. 12/23/22   Copland, Jacques, MD  losartan  (COZAAR ) 100 MG tablet TAKE 1 TABLET(100 MG) BY MOUTH DAILY 02/03/24   Copland, Jacques, MD  meclizine  (ANTIVERT ) 12.5 MG tablet Take 1 tablet (12.5 mg total) by mouth 3 (three) times daily as needed for dizziness. 10/04/23   Teresa Shelba SAUNDERS, NP  Multiple Vitamin (MULTIVITAMIN) tablet Take 1 tablet by mouth daily.    [provider]    Family History Family History  Problem Relation Age of Onset   Transient ischemic attack Father     Social History Social History[1]   Allergies   Augmentin  [amoxicillin -pot clavulanate]   Review of Systems Review of Systems  Constitutional:  Positive for chills. Negative for activity change, appetite change, diaphoresis, fatigue, fever and unexpected weight change.  HENT:  Positive for congestion, sinus pressure, sinus pain and sore throat. Negative for dental problem, drooling, ear discharge, ear pain, facial swelling, hearing loss, mouth sores, nosebleeds, postnasal drip, rhinorrhea, sneezing, tinnitus, trouble swallowing and voice change.   Respiratory:  Positive for cough and wheezing. Negative for apnea, choking, chest tightness, shortness of breath and stridor.   Gastrointestinal: Negative.   Neurological:  Positive for headaches. Negative for dizziness, tremors, seizures, syncope, facial asymmetry, speech difficulty, weakness, light-headedness and numbness.     Physical Exam Triage Vital Signs ED  Triage Vitals  Encounter Vitals Group     BP 03/19/24 1553 (!) 149/94     Girls Systolic BP Percentile --      Girls Diastolic BP Percentile --      Boys Systolic BP Percentile --      Boys Diastolic BP Percentile --      Pulse Rate 03/19/24 1553 79     Resp 03/19/24 1553 19     Temp 03/19/24 1553 98.5 F (36.9 C)     Temp Source 03/19/24 1553 Oral     SpO2 03/19/24 1553 97 %     Weight --      Height --      Head Circumference --      Peak Flow  --      Pain Score 03/19/24 1551 3     Pain Loc --      Pain Education --      Exclude from Growth Chart --    No data found.  Updated Vital Signs BP (!) 149/94 (BP Location: Right Arm)   Pulse 79   Temp 98.5 F (36.9 C) (Oral)   Resp 19   SpO2 97%   Visual Acuity Right Eye Distance:   Left Eye Distance:   Bilateral Distance:    Right Eye Near:   Left Eye Near:    Bilateral Near:     Physical Exam Constitutional:      Appearance: Normal appearance.  HENT:     Head: Normocephalic.     Right Ear: Tympanic membrane, ear canal and external ear normal.     Left Ear: Tympanic membrane, ear canal and external ear normal.     Nose: Congestion present.     Right Sinus: Frontal sinus tenderness present.     Left Sinus: Frontal sinus tenderness present.     Mouth/Throat:     Pharynx: No oropharyngeal exudate or posterior oropharyngeal erythema.  Cardiovascular:     Rate and Rhythm: Normal rate and regular rhythm.     Pulses: Normal pulses.     Heart sounds: Normal heart sounds.  Pulmonary:     Effort: Pulmonary effort is normal.     Breath sounds: Wheezing present.  Musculoskeletal:     Cervical back: Normal range of motion and neck supple.  Neurological:     Mental Status: He is alert.      UC Treatments / Results  Labs (all labs ordered are listed, but only abnormal results are displayed) Labs Reviewed - No data to display  EKG   Radiology No results found.  Procedures Procedures (including critical care time)  Medications Ordered in UC Medications - No data to display  Initial Impression / Assessment and Plan / UC Course  I have reviewed the triage vital signs and the nursing notes.  Pertinent labs & imaging results that were available during my care of the patient were reviewed by me and considered in my medical decision making (see chart for details).  Acute bronchitis, acute nonrecurrent frontal sinusitis  Patient is in no signs of distress nor  toxic appearing.  Vital signs are stable.  Low suspicion for pneumonia, pneumothorax or bronchitis and therefore will defer imaging.  Viral testing deferred due to timeline.  Prescribed doxycycline , albuterol , Tessalon  and Promethazine  DM for management.May use additional over-the-counter medications as needed for supportive care.  May follow-up with urgent care as needed if symptoms persist or worsen.  Final Clinical Impressions(s) / UC Diagnoses   Final diagnoses:  None   Discharge Instructions   None    ED Prescriptions   None    PDMP not reviewed this encounter.     [1]  Social History Tobacco Use   Smoking status: Never   Smokeless tobacco: Never  Vaping Use   Vaping status: Never Used  Substance Use Topics   Alcohol use: Yes    Comment: rare   Drug use: No     Teresa Shelba SAUNDERS, NP 03/19/24 1626  "

## 2024-03-19 NOTE — ED Triage Notes (Signed)
 Patient complains cough, bilateral ear fullness, chest congestion, headache and nasal congestion x 1 week. Patient has mucinex, Tylenol  and cough syrup. Patient was seen E-visit on 12-29- 25 for symptoms. Rates pain 3/10.

## 2024-03-19 NOTE — Discharge Instructions (Signed)
 Today you are being treated for a sinus infection as well as bronchitis, and to hear wheezing on your lungs which is a sign of airway tightness but you are getting enough air without assistance  Take doxycycline  twice daily for 7 days  You may take 2 puffs of albuterol  inhaler every 4 hours as needed for wheezing or shortness of breath  May take Tessalon  pill every 8 hours as needed for cough and may use cough syrup at bedtime to allow for rest    You can take Tylenol  and/or Ibuprofen  as needed for fever reduction and pain relief.   For cough: honey 1/2 to 1 teaspoon (you can dilute the honey in water or another fluid).  You can also use guaifenesin and dextromethorphan for cough. You can use a humidifier for chest congestion and cough.  If you don't have a humidifier, you can sit in the bathroom with the hot shower running.      For sore throat: try warm salt water gargles, cepacol lozenges, throat spray, warm tea or water with lemon/honey, popsicles or ice, or OTC cold relief medicine for throat discomfort.   For congestion: take a daily anti-histamine like Zyrtec, Claritin, and a oral decongestant, such as pseudoephedrine.  You can also use Flonase  1-2 sprays in each nostril daily.   It is important to stay hydrated: drink plenty of fluids (water, gatorade/powerade/pedialyte, juices, or teas) to keep your throat moisturized and help further relieve irritation/discomfort.
